# Patient Record
Sex: Female | Born: 1955 | Race: Black or African American | Hispanic: No | Smoking: Current every day smoker
Health system: Southern US, Community
[De-identification: ages and names within clinical notes are randomized; demographics above are authoritative.]

## PROBLEM LIST (undated history)

## (undated) DIAGNOSIS — Z21 Asymptomatic human immunodeficiency virus [HIV] infection status: Secondary | ICD-10-CM

## (undated) DIAGNOSIS — K8020 Calculus of gallbladder without cholecystitis without obstruction: Secondary | ICD-10-CM

## (undated) DIAGNOSIS — D649 Anemia, unspecified: Secondary | ICD-10-CM

## (undated) DIAGNOSIS — B20 Human immunodeficiency virus [HIV] disease: Secondary | ICD-10-CM

---

## 1999-02-16 ENCOUNTER — Emergency Department (HOSPITAL_COMMUNITY): Admission: EM | Admit: 1999-02-16 | Discharge: 1999-02-16 | Payer: Self-pay | Admitting: Emergency Medicine

## 1999-02-16 ENCOUNTER — Encounter: Payer: Self-pay | Admitting: Emergency Medicine

## 2000-04-06 ENCOUNTER — Emergency Department (HOSPITAL_COMMUNITY): Admission: EM | Admit: 2000-04-06 | Discharge: 2000-04-06 | Payer: Self-pay | Admitting: Emergency Medicine

## 2000-07-23 ENCOUNTER — Emergency Department (HOSPITAL_COMMUNITY): Admission: EM | Admit: 2000-07-23 | Discharge: 2000-07-23 | Payer: Self-pay | Admitting: Emergency Medicine

## 2001-06-14 ENCOUNTER — Emergency Department (HOSPITAL_COMMUNITY): Admission: EM | Admit: 2001-06-14 | Discharge: 2001-06-14 | Payer: Self-pay | Admitting: *Deleted

## 2002-11-26 ENCOUNTER — Emergency Department (HOSPITAL_COMMUNITY): Admission: EM | Admit: 2002-11-26 | Discharge: 2002-11-26 | Payer: Self-pay | Admitting: Emergency Medicine

## 2003-09-15 ENCOUNTER — Emergency Department (HOSPITAL_COMMUNITY): Admission: EM | Admit: 2003-09-15 | Discharge: 2003-09-16 | Payer: Self-pay | Admitting: Emergency Medicine

## 2003-09-16 ENCOUNTER — Ambulatory Visit (HOSPITAL_COMMUNITY): Admission: RE | Admit: 2003-09-16 | Discharge: 2003-09-16 | Payer: Self-pay | Admitting: *Deleted

## 2004-06-26 ENCOUNTER — Emergency Department (HOSPITAL_COMMUNITY): Admission: EM | Admit: 2004-06-26 | Discharge: 2004-06-26 | Payer: Self-pay | Admitting: *Deleted

## 2006-08-03 ENCOUNTER — Emergency Department (HOSPITAL_COMMUNITY): Admission: EM | Admit: 2006-08-03 | Discharge: 2006-08-03 | Payer: Self-pay | Admitting: Emergency Medicine

## 2007-10-24 ENCOUNTER — Emergency Department (HOSPITAL_COMMUNITY): Admission: EM | Admit: 2007-10-24 | Discharge: 2007-10-24 | Payer: Self-pay | Admitting: *Deleted

## 2008-12-03 ENCOUNTER — Emergency Department (HOSPITAL_COMMUNITY): Admission: EM | Admit: 2008-12-03 | Discharge: 2008-12-03 | Payer: Self-pay | Admitting: Emergency Medicine

## 2008-12-04 ENCOUNTER — Inpatient Hospital Stay (HOSPITAL_COMMUNITY): Admission: AD | Admit: 2008-12-04 | Discharge: 2008-12-14 | Payer: Self-pay | Admitting: Psychiatry

## 2008-12-04 ENCOUNTER — Ambulatory Visit: Payer: Self-pay | Admitting: Psychiatry

## 2009-08-12 ENCOUNTER — Emergency Department (HOSPITAL_COMMUNITY): Admission: EM | Admit: 2009-08-12 | Discharge: 2009-08-13 | Payer: Self-pay | Admitting: Emergency Medicine

## 2009-10-20 ENCOUNTER — Emergency Department (HOSPITAL_COMMUNITY): Admission: EM | Admit: 2009-10-20 | Discharge: 2009-10-20 | Payer: Self-pay | Admitting: Emergency Medicine

## 2011-02-03 LAB — URINALYSIS, ROUTINE W REFLEX MICROSCOPIC
Bilirubin Urine: NEGATIVE
Hgb urine dipstick: NEGATIVE
Nitrite: NEGATIVE
Specific Gravity, Urine: 1.025 (ref 1.005–1.030)
Urobilinogen, UA: 1 mg/dL (ref 0.0–1.0)
pH: 6.5 (ref 5.0–8.0)

## 2011-02-03 LAB — POCT I-STAT, CHEM 8
BUN: 25 mg/dL — ABNORMAL HIGH (ref 6–23)
Calcium, Ion: 1.15 mmol/L (ref 1.12–1.32)
Creatinine, Ser: 0.7 mg/dL (ref 0.4–1.2)
Glucose, Bld: 81 mg/dL (ref 70–99)
Hemoglobin: 13.6 g/dL (ref 12.0–15.0)
Potassium: 4 mEq/L (ref 3.5–5.1)
TCO2: 28 mmol/L (ref 0–100)

## 2011-02-03 LAB — URINE MICROSCOPIC-ADD ON

## 2011-02-03 LAB — COMPREHENSIVE METABOLIC PANEL
ALT: 13 U/L (ref 0–35)
AST: 22 U/L (ref 0–37)
CO2: 27 mEq/L (ref 19–32)
Calcium: 8.8 mg/dL (ref 8.4–10.5)
Creatinine, Ser: 0.56 mg/dL (ref 0.4–1.2)
GFR calc Af Amer: >60 mL/min (ref >60)
GFR calc non Af Amer: >60 mL/min (ref >60)
Sodium: 137 mEq/L (ref 135–145)
Total Protein: 6.3 g/dL (ref 6.0–8.3)

## 2011-02-03 LAB — ETHANOL: Alcohol, Ethyl (B): <5 mg/dL (ref 0–10)

## 2011-02-03 LAB — CBC
HCT: 36.3 % (ref 36.0–46.0)
Hemoglobin: 11.9 g/dL — ABNORMAL LOW (ref 12.0–15.0)
MCHC: 32.6 g/dL (ref 30.0–36.0)
MCV: 95.2 fL (ref 78.0–100.0)
Platelets: 234 10*3/uL (ref 150–400)
RBC: 3.81 MIL/uL — ABNORMAL LOW (ref 3.87–5.11)
RDW: 14.3 % (ref 11.5–15.5)

## 2011-02-03 LAB — TSH
TSH: 6.232 u[IU]/mL — ABNORMAL HIGH (ref 0.350–4.500)
TSH: 8.362 u[IU]/mL — ABNORMAL HIGH (ref 0.350–4.500)

## 2011-02-03 LAB — HEPATIC FUNCTION PANEL
Bilirubin, Direct: 0.1 mg/dL (ref 0.0–0.3)
Indirect Bilirubin: 0.5 mg/dL (ref 0.3–0.9)
Total Protein: 6.9 g/dL (ref 6.0–8.3)

## 2011-02-03 LAB — RAPID URINE DRUG SCREEN, HOSP PERFORMED: Tetrahydrocannabinol: NOT DETECTED
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
## 2011-03-06 NOTE — Discharge Summary (Signed)
[name], CORK NO.:  [id number]   MEDICAL RECORD NO.:  [id number]          PATIENT TYPE:  IPS   LOCATION:  0401                          [facility]ACILITY:  [facility]   PHYSICIAN:  Jasmine Pang, M.D. DATE OF BIRTH:  12-19-1955   DATE OF ADMISSION:  12/04/2008  DATE OF DISCHARGE:  12/14/2008                               DISCHARGE SUMMARY   IDENTIFICATION:  This is a 55 year old   Dictation ended at this point.      Jasmine Pang, M.D.  Electronically Signed     BHS/MEDQ  D:  01/05/2009  T:  01/06/2009  Job:  [id number]

## 2011-04-25 ENCOUNTER — Emergency Department (HOSPITAL_COMMUNITY)
Admission: EM | Admit: 2011-04-25 | Discharge: 2011-04-25 | Disposition: A | Discharge status: Home / Self Care | Payer: Self-pay | Attending: Emergency Medicine | Admitting: Emergency Medicine

## 2011-04-25 DIAGNOSIS — K219 Gastro-esophageal reflux disease without esophagitis: Secondary | ICD-10-CM | POA: Insufficient documentation

## 2011-04-25 DIAGNOSIS — R21 Rash and other nonspecific skin eruption: Secondary | ICD-10-CM | POA: Insufficient documentation

## 2011-04-25 DIAGNOSIS — L98499 Non-pressure chronic ulcer of skin of other sites with unspecified severity: Secondary | ICD-10-CM | POA: Insufficient documentation

## 2011-04-26 ENCOUNTER — Emergency Department (HOSPITAL_COMMUNITY)
Admission: EM | Admit: 2011-04-26 | Discharge: 2011-04-26 | Disposition: A | Discharge status: Home / Self Care | Payer: Self-pay | Attending: Emergency Medicine | Admitting: Emergency Medicine

## 2011-04-26 DIAGNOSIS — IMO0002 Reserved for concepts with insufficient information to code with codable children: Secondary | ICD-10-CM | POA: Insufficient documentation

## 2011-04-26 DIAGNOSIS — D649 Anemia, unspecified: Secondary | ICD-10-CM | POA: Insufficient documentation

## 2011-04-26 DIAGNOSIS — K219 Gastro-esophageal reflux disease without esophagitis: Secondary | ICD-10-CM | POA: Insufficient documentation

## 2011-04-26 DIAGNOSIS — X58XXXA Exposure to other specified factors, initial encounter: Secondary | ICD-10-CM | POA: Insufficient documentation

## 2011-07-08 LAB — I-STAT 8, (EC8 V) (CONVERTED LAB)
BUN: 8
Bicarbonate: 26.4 — ABNORMAL HIGH
Chloride: 105
Glucose, Bld: 107 — ABNORMAL HIGH
HCT: 36
Hemoglobin: 12.2
Operator id: 288831
Potassium: 3.4 — ABNORMAL LOW
Sodium: 140
TCO2: 28
pCO2, Ven: 48.6
pH, Ven: 7.343 — ABNORMAL HIGH

## 2011-07-08 LAB — POCT I-STAT CREATININE
Creatinine, Ser: 0.7
Operator id: 288831

## 2011-10-16 ENCOUNTER — Emergency Department (HOSPITAL_COMMUNITY)
Admission: EM | Admit: 2011-10-16 | Discharge: 2011-10-16 | Disposition: A | Discharge status: Home / Self Care | Payer: Self-pay | Attending: Emergency Medicine | Admitting: Emergency Medicine

## 2011-10-16 DIAGNOSIS — L259 Unspecified contact dermatitis, unspecified cause: Secondary | ICD-10-CM | POA: Insufficient documentation

## 2011-10-16 DIAGNOSIS — F17200 Nicotine dependence, unspecified, uncomplicated: Secondary | ICD-10-CM | POA: Insufficient documentation

## 2011-10-16 DIAGNOSIS — L299 Pruritus, unspecified: Secondary | ICD-10-CM | POA: Insufficient documentation

## 2011-10-16 DIAGNOSIS — L309 Dermatitis, unspecified: Secondary | ICD-10-CM

## 2011-10-16 MED ORDER — PREDNISONE 10 MG PO TABS: 20.0000 mg | ORAL_TABLET | Freq: Every day | ORAL | Status: DC

## 2011-10-16 MED ORDER — METHYLPREDNISOLONE SODIUM SUCC 125 MG IJ SOLR
125.0000 mg | Freq: Once | INTRAMUSCULAR | Status: AC
  Administered 2011-10-16: 125 mg via INTRAMUSCULAR
  Filled 2011-10-16: qty 2

## 2011-10-16 NOTE — ED Notes (Signed)
Pt. Has a rash all over her body,, broke out about 1 week ago

## 2011-10-16 NOTE — ED Provider Notes (Cosign Needed)
History     CSN: [id number]  Arrival date & time 10/16/11  1211   First MD Initiated Contact with Patient 10/16/11 1317      Chief Complaint  Patient presents with  . Rash    (Consider location/radiation/quality/duration/timing/severity/associated sxs/prior treatment) Patient is a 55 y.o. female presenting with rash. The history is provided by the patient (The patient complains of a burning rash for a few days. Patient has a rash on her chest back and legs.).  Rash  This is a new problem. The current episode started more than 2 days ago. The problem has not changed since onset.The problem is associated with nothing. There has been no fever. The rash is present on the trunk. The pain is at a severity of 0/10. The patient is experiencing no pain. Associated symptoms include itching. Pertinent negatives include no pain. She has tried nothing for the symptoms. The treatment provided no relief.    History reviewed. No pertinent past medical history.  History reviewed. No pertinent past surgical history.  No family history on file.  History  Substance Use Topics  . Smoking status: Current Everyday Smoker  . Smokeless tobacco: Never Used  . Alcohol Use: No    OB History    Grav Para Term Preterm Abortions TAB SAB Ect Mult Living                  Review of Systems  Constitutional: Negative for fatigue.  HENT: Negative for congestion, sinus pressure and ear discharge.   Eyes: Negative for discharge.  Respiratory: Negative for cough.   Cardiovascular: Negative for chest pain.  Gastrointestinal: Negative for abdominal pain and diarrhea.  Genitourinary: Negative for frequency and hematuria.  Musculoskeletal: Negative for back pain.  Skin: Positive for itching and rash.  Neurological: Negative for seizures and headaches.  Hematological: Negative.   Psychiatric/Behavioral: Negative for hallucinations.    Allergies  Review of patient's allergies indicates no known  allergies.  Home Medications   Current Outpatient Rx  Name Route Sig Dispense Refill  . PREDNISONE 10 MG PO TABS Oral Take 2 tablets (20 mg total) by mouth daily. 10 tablet 0    BP 107/70  Pulse 78  Temp(Src) 97.8 F (36.6 C) (Oral)  Resp 16  SpO2 99%  Physical Exam  Constitutional: She is oriented to person, place, and time. She appears well-developed.  HENT:  Head: Normocephalic and atraumatic.  Eyes: Conjunctivae and EOM are normal. No scleral icterus.  Neck: Neck supple. No thyromegaly present.  Cardiovascular: Normal rate and regular rhythm.  Exam reveals no gallop and no friction rub.   No murmur heard. Pulmonary/Chest: No stridor. She has no wheezes. She has no rales. She exhibits no tenderness.  Abdominal: She exhibits no distension. There is no tenderness. There is no rebound.  Musculoskeletal: Normal range of motion. She exhibits no edema.  Lymphadenopathy:    She has no cervical adenopathy.  Neurological: She is oriented to person, place, and time. Coordination normal.  Skin: Rash noted.       Patient has what appears to be welts on her chest back and lower extremities. The welts are puritic  Psychiatric: She has a normal mood and affect. Her behavior is normal.    ED Course  Procedures (including critical care time)  Labs Reviewed - No data to display No results found.   1. Dermatitis       MDM  Allergic dermatitis        Benny Lennert,  MD 10/16/11 1451
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
## 2012-03-23 ENCOUNTER — Emergency Department (HOSPITAL_COMMUNITY)
Admission: EM | Admit: 2012-03-23 | Discharge: 2012-03-23 | Disposition: A | Discharge status: Home / Self Care | Payer: Self-pay | Attending: Emergency Medicine | Admitting: Emergency Medicine

## 2012-03-23 ENCOUNTER — Encounter (HOSPITAL_COMMUNITY): Payer: Self-pay | Admitting: Emergency Medicine

## 2012-03-23 DIAGNOSIS — F17200 Nicotine dependence, unspecified, uncomplicated: Secondary | ICD-10-CM | POA: Insufficient documentation

## 2012-03-23 DIAGNOSIS — Z21 Asymptomatic human immunodeficiency virus [HIV] infection status: Secondary | ICD-10-CM | POA: Insufficient documentation

## 2012-03-23 DIAGNOSIS — R21 Rash and other nonspecific skin eruption: Secondary | ICD-10-CM | POA: Insufficient documentation

## 2012-03-23 HISTORY — DX: Human immunodeficiency virus (HIV) disease: B20

## 2012-03-23 HISTORY — DX: Calculus of gallbladder without cholecystitis without obstruction: K80.20

## 2012-03-23 HISTORY — DX: Anemia, unspecified: D64.9

## 2012-03-23 HISTORY — DX: Asymptomatic human immunodeficiency virus (hiv) infection status: Z21

## 2012-03-23 MED ORDER — HYDROXYZINE HCL 25 MG PO TABS: 25.0000 mg | ORAL_TABLET | Freq: Four times a day (QID) | ORAL | Status: AC

## 2012-03-23 MED ORDER — DEXAMETHASONE SODIUM PHOSPHATE 10 MG/ML IJ SOLN
10.0000 mg | Freq: Once | INTRAMUSCULAR | Status: AC
  Administered 2012-03-23: 10 mg via INTRAMUSCULAR
  Filled 2012-03-23: qty 1

## 2012-03-23 NOTE — ED Provider Notes (Signed)
History   This chart was scribed for Marcia Kras, MD by Melba Coon. The patient was seen in room STRE3/STRE3 and the patient's care was started at 12:44PM.    CSN: [id number]  Arrival date & time 03/23/12  1221   None     Chief Complaint  Patient presents with  . Rash    (Consider location/radiation/quality/duration/timing/severity/associated sxs/prior treatment) HPI [name] is a 56 y.o. female who presents to the Emergency Department complaining of a persistent, moderate to severe rash around the torso, buttocks, bilateral arms and legs with an onset 6 months ago that has gotten progressively worse. Pt states she has a Hx of HIV, but previous medical records only show Hx of dermatitis and pt does not take medications for HIV. Pt states she received a steroid shot at the shelter; also states that the needle wasn't "clean"; steroid shot did not alleviate any skin symptoms. No HA, fever, neck pain, sore throat, back pain, CP, SOB, abd pain, n/v/d, dysuria, or extremity pain, edema, weakness, numbness, or tingling. No known allergies. No other pertinent medical symptoms.  No PCP   Past Medical History  Diagnosis Date  . HIV (human immunodeficiency virus infection)   . Anemia   . Gallstone     History reviewed. No pertinent past surgical history.  No family history on file.  History  Substance Use Topics  . Smoking status: Current Everyday Smoker  . Smokeless tobacco: Never Used  . Alcohol Use: No    OB History    Grav Para Term Preterm Abortions TAB SAB Ect Mult Living                  Review of Systems 10 Systems reviewed and all are negative for acute change except as noted in the HPI.   Allergies  Review of patient's allergies indicates no known allergies.  Home Medications  No current outpatient prescriptions on file.  BP 125/81  Pulse 71  Temp(Src) 98.5 F (36.9 C) (Oral)  Resp 18  SpO2 99%  Physical Exam  Nursing note and vitals  reviewed. Constitutional: She appears well-developed and well-nourished. No distress.  HENT:  Head: Normocephalic and atraumatic.  Right Ear: External ear normal.  Left Ear: External ear normal.  Eyes: Conjunctivae are normal. Right eye exhibits no discharge. Left eye exhibits no discharge. No scleral icterus.  Neck: Neck supple. No tracheal deviation present.  Cardiovascular: Normal rate.   Pulmonary/Chest: Effort normal. No stridor. No respiratory distress.  Musculoskeletal: She exhibits no edema.  Neurological: She is alert. Cranial nerve deficit: no gross deficits.  Skin: Skin is warm and dry. No rash noted.       Light pink rash around the torso, bilateral buttocks arms and legs  Psychiatric: She has a normal mood and affect.    ED Course  Procedures (including critical care time)  DIAGNOSTIC STUDIES: Oxygen Saturation is 99% on room air, normal by my interpretation.    COORDINATION OF CARE:  12:48PM - steroid shot will be ordered for the pt.   Labs Reviewed - No data to display No results found.   1. Rash       MDM  Non specific rash.  Recc follow up with PCP.  Steroid offered as requested by patient. I personally performed the services described in this documentation, which was scribed in my presence.  The recorded information has been reviewed and considered.         Marcia Kras, MD 03/25/12  1333 
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
## 2012-03-23 NOTE — Discharge Instructions (Signed)
RESOURCE GUIDE  Chronic Pain Problems: Contact [facility]  [phone] Patients need to be referred by their primary care doctor.  Insufficient Money for Medicine: Contact United Way:  call "211" or [facility] [phone].  No Primary Care Doctor: - Call [facility]  [phone] - can help you locate a primary care doctor that  accepts your insurance, provides certain services, etc. - Physician Referral Service- 1-800-533-3463  Agencies that provide inexpensive medical care: - [facility]  [phone] - [facility]  [phone] - [facility]  [phone] - [facility]  [phone] - Planned Parenthood  [phone] - [facility]  [phone]  Medicaid-accepting [place] Providers: - [facility]- [address]  [phone], Mon-Fri 9am-7pm, Sat 9am-1pm - [facility]- [address], [address]  [phone] - [facility]- [address], [address]  [phone] - [facility]- [address] [place] [address]  [phone] - Veita Bland- [address], [phone]  Only accepts [place] Access Medicaid patients after they have their name  applied to their card  Self Pay (no insurance) in [place]: - Sickle Cell Patients: Dr Eric Dean, [facility]  [address], [phone] - [facility]- [address]  [phone]       -     [facility]- [address]       -     [facility]- see information above (Speak to Pam H if you do not have insurance)       -  [facility]- [address], [phone]       -  [facility] [place]- [address],  [phone]       -  [facility]- [address] [place] [address], [phone]       -  Dr Osei-Bonsu-  [address], [place], [phone]       -  [facility]- [address], [phone]       -  [facility]- [address] [place] [address], [phone], also [address], [phone]       -    [facility]- [address], [phone], 1st & 3rd Saturday   every month, 10am-1pm  1) Find a Doctor and Pay Out of Pocket Although you won't have to find out who is covered by your insurance plan, it is a good idea to ask around and get recommendations. You will then need to call the office and see if the doctor you have chosen will accept you as a new patient and what types of options they offer for patients who are self-pay. Some doctors offer discounts or will set up payment plans for their patients who do not have insurance, but you will need to ask so you aren't surprised when you get to your appointment.  2) Contact Your Local Health Department Not all health departments have doctors that can see patients for sick visits, but many do, so it is worth a call to see if yours does. If you don't know where your local health department is, you can check in your phone book. The CDC also has a tool to help you locate your state's health department, and many state websites also have   listings of all of their local health departments.  3) Find a Walk-in Clinic If your illness is not likely to be very severe or complicated, you may want to try a walk in clinic. These are popping up all over the country in pharmacies, drugstores, and shopping centers. They're usually staffed by nurse practitioners or physician assistants that have been trained to treat common illnesses and complaints. They're usually fairly quick and inexpensive. However, if you have serious medical issues or chronic medical problems, these are probably not your best option  STD Testing - [place] [facility] [place], [facility] Clinic, [address], [place], phone [phone] or 1-877-539-9860.  Monday - Friday, call for an appointment. - [place]  [facility] [place], [facility] Clinic, [address], [place], phone [phone] or 1-877-539-9860.  Monday - Friday, call for an appointment.  Abuse/Neglect: - [place] Child Abuse Hotline (336) 641-3795 - [place] Child Abuse Hotline 800-378-5315 (After Hours)  Emergency Shelter:  [facility] (336) 271-5985  Maternity Homes: - [facility] (336) 275-9566 - [facility] (704) 372-4663  MRSA Hotline #:   832-7006  [place] Resources  [facility] [place]  [employer] [place] [facility] [address]                 [address]         [address]  [place]                                               [place]                              [place] Phone:  [phone]                                  Phone:  [phone]                   Phone:  [phone]  [place] [facility], [phone] - [place] Services - [facility]- 1-888-581-9988       -     [facility] in [place], [address],                                  336-349-4454, Insurance  [place] Child Abuse Hotline (336) 342-1394 or (336) 342-3537 (After Hours)   Behavioral Health Services  Substance Abuse Resources: - [facility]  336-882-2125 - [facility] 336-784-9470 - [facility] 336-285-9073 - [facility] 336-845-3988 - Residential & Outpatient Substance Abuse Program  800-659-3381  Psychological Services: -  Health  [phone] - [facility]  [phone] - [place] [facility], [address], [place], ACCESS LINE: 1-800-853-5163 or 336-641-4981, Http://www.[facility].com/services/adult.htm  Dental Assistance  If unable to pay or uninsured, contact:  [facility] or [place] [facility] to become qualified for the adult dental  clinic.  Patients with Medicaid:  [facility] [address], [phone], [phone]  If unable   to pay, or uninsured, contact [facility] [phone]) or [facility] [phone] in [place], [phone] in [facility]) to become qualified for the adult dental clinic  Other Low-Cost Community Dental Services: - [facility]- [address] [place], [place], [place], [address], [phone], [phone], 2nd and 4th Thursday of the month at 6:30am.  10 clients each day by appointment, can sometimes see walk-in patients if someone does not show for an appointment. [facility]- [address] [place], [place], [address], [phone] - [facility]- [address] [place], [place], [address], [phone] [facility]- [phone] [facility] Health Department- [phone] [facility] [phone] -  Rash A rash is a change in the color or feel of your skin. There are many different types of rashes. You may have other problems along with your rash.  Follow up with a primary care doctor to have this rash evaluated further.   HOME CARE  Avoid the thing that caused your rash.   Do not scratch your rash.   You may take cools baths to help stop itching.   Only take medicines as told by your doctor.   Keep all doctor visits as told.  GET HELP RIGHT AWAY IF:   Your pain, puffiness (swelling), or redness gets worse.   You have a fever.   You have new or severe problems.   You have body aches, watery poop (diarrhea), or you throw up (vomit).   Your rash is not better after 3 days.  MAKE SURE YOU:   Understand these instructions.   Will watch your condition.   Will get help right away if you are not doing well or get worse.  Document Released: 03/23/2008 Document Revised: 09/24/2011 Document Reviewed: 07/20/2011 [facility] Patient Information 2012 [place],  [place].

## 2012-03-23 NOTE — ED Notes (Signed)
States have a rash for 6  months and worsening overtime light pink rash buttocks, arms and legs. Airway intact.

## 2020-02-23 ENCOUNTER — Other Ambulatory Visit: Payer: Self-pay | Admitting: Clinical

## 2020-02-27 ENCOUNTER — Encounter: Payer: Self-pay | Admitting: Clinical

## 2020-02-27 NOTE — Progress Notes (Signed)
[name] received call from sister [facility] requesting services for sister [name] due to missing appointment on Friday. [name] said she ended up going somewhere else where they said they were unable to help them, [name] informed they didn't come here as [name] was present and no patients came in on this day.  Sister shares [name] does not have a PCP, no insurance, hx of loss of a son and a husband and wants to help her get the services needed to apply for disability, etc as patient only has food stamps. [name] was not clear if [name] wanted MH therapy only kept referring it would help her. Based on all the information received today [name] recommends to come in for a PCP appointment to assess for needs and if MH is required it will be addressed. [name] informed of cost and recommended coming prior to pick up orange card for a lower cost. Sister agreed to come in to pick up application. [name] said patient lives with a son but sister takes care of needs so she receives the mail. [name] and [name] were unable to get full address due to unable to recognize the address that was being provided. We recommended on date of PCP appointment to bring in documentation of the address.

## 2020-04-16 ENCOUNTER — Ambulatory Visit: Payer: Self-pay | Admitting: Internal Medicine

## 2020-05-07 ENCOUNTER — Ambulatory Visit: Payer: Self-pay | Admitting: Internal Medicine

## 2020-05-20 ENCOUNTER — Ambulatory Visit: Payer: Self-pay | Admitting: Internal Medicine

## 2020-06-28 ENCOUNTER — Ambulatory Visit: Payer: Self-pay | Admitting: Internal Medicine

## 2020-06-28 ENCOUNTER — Encounter: Payer: Self-pay | Admitting: Internal Medicine

## 2021-04-17 ENCOUNTER — Other Ambulatory Visit (HOSPITAL_COMMUNITY): Payer: Self-pay | Admitting: Pulmonary Disease

## 2021-04-17 ENCOUNTER — Other Ambulatory Visit: Payer: Self-pay | Admitting: Pulmonary Disease

## 2021-04-17 DIAGNOSIS — R1013 Epigastric pain: Secondary | ICD-10-CM

## 2021-04-22 ENCOUNTER — Encounter (HOSPITAL_COMMUNITY): Payer: Self-pay | Admitting: *Deleted

## 2021-04-22 ENCOUNTER — Inpatient Hospital Stay (HOSPITAL_COMMUNITY)
Admission: EM | Admit: 2021-04-22 | Discharge: 2021-04-25 | DRG: 379 | Disposition: A | Discharge status: Home / Self Care | Payer: Medicaid Other | Attending: Internal Medicine | Admitting: Internal Medicine

## 2021-04-22 ENCOUNTER — Emergency Department (HOSPITAL_COMMUNITY): Payer: Medicaid Other

## 2021-04-22 ENCOUNTER — Inpatient Hospital Stay (HOSPITAL_COMMUNITY): Payer: Medicaid Other

## 2021-04-22 DIAGNOSIS — K319 Disease of stomach and duodenum, unspecified: Secondary | ICD-10-CM | POA: Diagnosis not present

## 2021-04-22 DIAGNOSIS — K5731 Diverticulosis of large intestine without perforation or abscess with bleeding: Principal | ICD-10-CM | POA: Diagnosis present

## 2021-04-22 DIAGNOSIS — K922 Gastrointestinal hemorrhage, unspecified: Secondary | ICD-10-CM | POA: Diagnosis not present

## 2021-04-22 DIAGNOSIS — D649 Anemia, unspecified: Secondary | ICD-10-CM

## 2021-04-22 DIAGNOSIS — Z79899 Other long term (current) drug therapy: Secondary | ICD-10-CM

## 2021-04-22 DIAGNOSIS — D509 Iron deficiency anemia, unspecified: Secondary | ICD-10-CM

## 2021-04-22 DIAGNOSIS — K5730 Diverticulosis of large intestine without perforation or abscess without bleeding: Secondary | ICD-10-CM | POA: Diagnosis not present

## 2021-04-22 DIAGNOSIS — F17210 Nicotine dependence, cigarettes, uncomplicated: Secondary | ICD-10-CM | POA: Diagnosis not present

## 2021-04-22 DIAGNOSIS — R519 Headache, unspecified: Secondary | ICD-10-CM | POA: Diagnosis present

## 2021-04-22 DIAGNOSIS — B20 Human immunodeficiency virus [HIV] disease: Secondary | ICD-10-CM

## 2021-04-22 DIAGNOSIS — Z20822 Contact with and (suspected) exposure to covid-19: Secondary | ICD-10-CM | POA: Diagnosis present

## 2021-04-22 LAB — FOLATE: Folate: 16.2 ng/mL (ref >5.9)

## 2021-04-22 LAB — PREPARE RBC (CROSSMATCH)

## 2021-04-22 LAB — COMPREHENSIVE METABOLIC PANEL
ALT: 9 U/L (ref 0–44)
AST: 23 U/L (ref 15–41)
Albumin: 3.9 g/dL (ref 3.5–5.0)
Alkaline Phosphatase: 59 U/L (ref 38–126)
Anion gap: 8 (ref 5–15)
BUN: 8 mg/dL (ref 8–23)
CO2: 26 mmol/L (ref 22–32)
Calcium: 9 mg/dL (ref 8.9–10.3)
Chloride: 105 mmol/L (ref 98–111)
Creatinine, Ser: 0.67 mg/dL (ref 0.44–1.00)
GFR, Estimated: >60 mL/min (ref >60)
Glucose, Bld: 89 mg/dL (ref 70–99)
Potassium: 3.6 mmol/L (ref 3.5–5.1)
Sodium: 139 mmol/L (ref 135–145)
Total Bilirubin: 0.9 mg/dL (ref 0.3–1.2)
Total Protein: 7.6 g/dL (ref 6.5–8.1)

## 2021-04-22 LAB — ABO/RH: ABO/RH(D): O POS

## 2021-04-22 LAB — PROTIME-INR
INR: 1.1 (ref 0.8–1.2)
Prothrombin Time: 14.3 seconds (ref 11.4–15.2)

## 2021-04-22 LAB — RETICULOCYTES
Immature Retic Fract: 23.6 % — ABNORMAL HIGH (ref 2.3–15.9)
RBC.: 2.62 MIL/uL — ABNORMAL LOW (ref 3.87–5.11)
Retic Count, Absolute: 22.8 10*3/uL (ref 19.0–186.0)
Retic Ct Pct: 0.9 % (ref 0.4–3.1)

## 2021-04-22 LAB — RESP PANEL BY RT-PCR (FLU A&B, COVID) ARPGX2
Influenza A by PCR: NEGATIVE
Influenza B by PCR: NEGATIVE
SARS Coronavirus 2 by RT PCR: NEGATIVE

## 2021-04-22 LAB — CBC
HCT: 17.4 % — ABNORMAL LOW (ref 36.0–46.0)
Hemoglobin: 4.5 g/dL — CL (ref 12.0–15.0)
MCH: 17.2 pg — ABNORMAL LOW (ref 26.0–34.0)
MCHC: 25.9 g/dL — ABNORMAL LOW (ref 30.0–36.0)
MCV: 66.7 fL — ABNORMAL LOW (ref 80.0–100.0)
Platelets: 799 10*3/uL — ABNORMAL HIGH (ref 150–400)
RBC: 2.61 MIL/uL — ABNORMAL LOW (ref 3.87–5.11)
RDW: 31.9 % — ABNORMAL HIGH (ref 11.5–15.5)
WBC: 4.4 10*3/uL (ref 4.0–10.5)
nRBC: 0.7 % — ABNORMAL HIGH (ref 0.0–0.2)

## 2021-04-22 LAB — IRON AND TIBC
Iron: 17 ug/dL — ABNORMAL LOW (ref 28–170)
Saturation Ratios: 3 % — ABNORMAL LOW (ref 10.4–31.8)
TIBC: 622 ug/dL — ABNORMAL HIGH (ref 250–450)
UIBC: 605 ug/dL

## 2021-04-22 LAB — LIPASE, BLOOD: Lipase: 33 U/L (ref 11–51)

## 2021-04-22 LAB — POC OCCULT BLOOD, ED: Fecal Occult Bld: NEGATIVE

## 2021-04-22 LAB — FERRITIN
Ferritin: 1 ng/mL — ABNORMAL LOW (ref 11–307)
Ferritin: 1 ng/mL — ABNORMAL LOW (ref 11–307)

## 2021-04-22 LAB — VITAMIN B12: Vitamin B-12: 589 pg/mL (ref 180–914)

## 2021-04-22 LAB — TSH: TSH: 3.66 u[IU]/mL (ref 0.350–4.500)

## 2021-04-22 MED ORDER — ONDANSETRON HCL 4 MG PO TABS: 4.0000 mg | ORAL_TABLET | Freq: Four times a day (QID) | ORAL | Status: DC | PRN

## 2021-04-22 MED ORDER — DEXTROSE-NACL 5-0.45 % IV SOLN: INTRAVENOUS | Status: DC

## 2021-04-22 MED ORDER — SENNOSIDES-DOCUSATE SODIUM 8.6-50 MG PO TABS: 1.0000 | ORAL_TABLET | Freq: Every evening | ORAL | Status: DC | PRN

## 2021-04-22 MED ORDER — SODIUM CHLORIDE 0.9 % IV SOLN
10.0000 mL/h | Freq: Once | INTRAVENOUS | Status: AC
  Administered 2021-04-22: 10 mL/h via INTRAVENOUS

## 2021-04-22 MED ORDER — DM-GUAIFENESIN ER 30-600 MG PO TB12: 1.0000 | ORAL_TABLET | Freq: Two times a day (BID) | ORAL | Status: DC | PRN

## 2021-04-22 MED ORDER — IPRATROPIUM-ALBUTEROL 0.5-2.5 (3) MG/3ML IN SOLN: 3.0000 mL | RESPIRATORY_TRACT | Status: DC | PRN

## 2021-04-22 MED ORDER — POTASSIUM CHLORIDE 10 MEQ/100ML IV SOLN
10.0000 meq | INTRAVENOUS | Status: AC
  Administered 2021-04-22: 10 meq via INTRAVENOUS
  Filled 2021-04-22: qty 100

## 2021-04-22 MED ORDER — ACETAMINOPHEN 325 MG PO TABS
650.0000 mg | ORAL_TABLET | Freq: Four times a day (QID) | ORAL | Status: DC | PRN
  Administered 2021-04-23: 650 mg via ORAL
  Filled 2021-04-22: qty 2

## 2021-04-22 MED ORDER — HYDROCODONE-ACETAMINOPHEN 5-325 MG PO TABS: 1.0000 | ORAL_TABLET | ORAL | Status: DC | PRN

## 2021-04-22 MED ORDER — IOHEXOL 300 MG/ML  SOLN
100.0000 mL | Freq: Once | INTRAMUSCULAR | Status: AC | PRN
  Administered 2021-04-22: 100 mL via INTRAVENOUS

## 2021-04-22 MED ORDER — BISACODYL 5 MG PO TBEC: 5.0000 mg | DELAYED_RELEASE_TABLET | Freq: Every day | ORAL | Status: DC | PRN

## 2021-04-22 MED ORDER — PANTOPRAZOLE SODIUM 40 MG IV SOLR
40.0000 mg | Freq: Two times a day (BID) | INTRAVENOUS | Status: DC
  Administered 2021-04-22 – 2021-04-24 (×3): 40 mg via INTRAVENOUS
  Filled 2021-04-22 (×4): qty 40

## 2021-04-22 MED ORDER — ONDANSETRON HCL 4 MG/2ML IJ SOLN: 4.0000 mg | Freq: Four times a day (QID) | INTRAMUSCULAR | Status: DC | PRN

## 2021-04-22 NOTE — ED Notes (Signed)
Patient transported to CT 

## 2021-04-22 NOTE — ED Notes (Signed)
Blood bank has 2 units of blood ready for this patient. Notified Danielle,RN.

## 2021-04-22 NOTE — ED Provider Notes (Signed)
Emergency Medicine Provider Triage Evaluation Note  [name] , a 65 y.o. female  was evaluated in triage.  Pt complains of headache and abdominal pain.  Reports nausea but denies any vomiting.  Intermittent symptoms for the past few weeks.  Denies any changes to gait, vision changes, numbness in arms or legs.  Review of Systems  Positive: Headache, abdominal pain, nausea Negative: Vomiting, vision changes,  Physical Exam  BP 109/72 (BP Location: Right Arm)   Pulse 92   Temp 99.1 F (37.3 C) (Oral)   Resp 18   SpO2 97%  Gen:   Awake, no distress   Resp:  Normal effort  MSK:   Moves extremities without difficulty  Other:  Tenderness in the generalized abdominal area, moving all extremities, ambulatory without difficulty  Medical Decision Making  Medically screening exam initiated at 2:17 PM.  Appropriate orders placed.  [name] was informed that the remainder of the evaluation will be completed by another provider, this initial triage assessment does not replace that evaluation, and the importance of remaining in the ED until their evaluation is complete.  Imaging and labs ordered   Dietrich Pates, Cordelia Poche 04/22/21 1418    Linwood Dibbles, MD 04/23/21 [phone]

## 2021-04-22 NOTE — ED Provider Notes (Signed)
[facility]-EMERGENCY DEPT Provider Note   CSN: [id number] Arrival date & time: 04/22/21  1352     History Chief Complaint  Patient presents with   Abdominal Pain    [name] is a 65 y.o. female.   Abdominal Pain  Patient presents to the ED for evaluation of abdominal pain and cramping.  She is also had nausea but no vomiting.  She also has had persistent headache ongoing for several weeks.  She denies any fevers or chills.  Patient also has noticed some blood in her stool.  She denies any focal numbness or weakness.  No neck pain.  No rashes.  Patient went to her doctor's office.  She had blood test and was told she was anemic.  She was sent to the ED for further evaluation  Past Medical History:  Diagnosis Date   Anemia    Gallstone    HIV (human immunodeficiency virus infection) (HCC)     There are no problems to display for this patient.   History reviewed. No pertinent surgical history.   OB History   No obstetric history on file.     No family history on file.  Social History   Tobacco Use   Smoking status: Every Day    Pack years: 0.00   Smokeless tobacco: Never  Substance Use Topics   Alcohol use: No   Drug use: No    Home Medications Prior to Admission medications   Medication Sig Start Date End Date Taking? Authorizing Provider  Aspirin-Acetaminophen-Caffeine (GOODY HEADACHE PO) Take 1 packet by mouth 2 (two) times daily as needed (FOR HEADACHES).   Yes [provider]  cefUROXime (CEFTIN) 250 MG tablet Take 250 mg by mouth 2 (two) times daily. 04/17/21  Yes [provider]    Allergies    Patient has no known allergies.  Review of Systems   Review of Systems  Gastrointestinal:  Positive for abdominal pain.  All other systems reviewed and are negative.  Physical Exam Updated Vital Signs BP 119/71   Pulse (!) 58   Temp 99.1 F (37.3 C) (Oral)   Resp 16   SpO2 100%   Physical Exam Vitals and  nursing note reviewed.  Constitutional:      Appearance: She is well-developed.     Comments: Thin underweight  HENT:     Head: Normocephalic and atraumatic.     Right Ear: External ear normal.     Left Ear: External ear normal.  Eyes:     General: No scleral icterus.       Right eye: No discharge.        Left eye: No discharge.     Conjunctiva/sclera: Conjunctivae normal.  Neck:     Trachea: No tracheal deviation.  Cardiovascular:     Rate and Rhythm: Normal rate and regular rhythm.  Pulmonary:     Effort: Pulmonary effort is normal. No respiratory distress.     Breath sounds: Normal breath sounds. No stridor. No wheezing or rales.  Abdominal:     General: Bowel sounds are normal. There is no distension.     Palpations: Abdomen is soft.     Tenderness: There is no abdominal tenderness. There is no guarding or rebound.  Genitourinary:    Comments: Dark stool, no bright red blood Musculoskeletal:        General: No tenderness or deformity.     Cervical back: Neck supple.  Skin:    General: Skin is  warm and dry.     Findings: No rash.  Neurological:     General: No focal deficit present.     Mental Status: She is alert.     Cranial Nerves: No cranial nerve deficit (no facial droop, extraocular movements intact, no slurred speech).     Sensory: No sensory deficit.     Motor: No abnormal muscle tone or seizure activity.     Coordination: Coordination normal.  Psychiatric:        Mood and Affect: Mood normal.    ED Results / Procedures / Treatments   Labs (all labs ordered are listed, but only abnormal results are displayed) Labs Reviewed  CBC - Abnormal; Notable for the following components:      Result Value   RBC 2.61 (*)    Hemoglobin 4.5 (*)    HCT 17.4 (*)    MCV 66.7 (*)    MCH 17.2 (*)    MCHC 25.9 (*)    RDW 31.9 (*)    Platelets 799 (*)    nRBC 0.7 (*)    All other components within normal limits  RESP PANEL BY RT-PCR (FLU A&B, COVID) ARPGX2  LIPASE,  BLOOD  COMPREHENSIVE METABOLIC PANEL  URINALYSIS, ROUTINE W REFLEX MICROSCOPIC  VITAMIN B12  FOLATE  IRON AND TIBC  FERRITIN  RETICULOCYTES  POC OCCULT BLOOD, ED  TYPE AND SCREEN    EKG None  Radiology No results found.  Procedures .Critical Care  Date/Time: 04/22/2021 4:25 PM Performed by: Linwood Dibbles, MD Authorized by: Linwood Dibbles, MD   Critical care provider statement:    Critical care time (minutes):  30   Critical care was time spent personally by me on the following activities:  Discussions with consultants, evaluation of patient's response to treatment, examination of patient, ordering and performing treatments and interventions, ordering and review of laboratory studies, ordering and review of radiographic studies, pulse oximetry, re-evaluation of patient's condition, obtaining history from patient or surrogate and review of old charts   Medications Ordered in ED Medications  0.9 %  sodium chloride infusion (has no administration in time range)    ED Course  I have reviewed the triage vital signs and the nursing notes.  Pertinent labs & imaging results that were available during my care of the patient were reviewed by me and considered in my medical decision making (see chart for details).  Clinical Course as of 04/23/21 0859  Tue Apr 22, 2021  1550 Head CT negative [JK]    Clinical Course User Index [JK] Linwood Dibbles, MD   MDM Rules/Calculators/A&P                          Patient presented to the ED for evaluation of headache as well as abdominal pain.  Patient's abdominal exam is benign.  No peritoneal signs.  No guarding.  No signs of hepatitis or pancreatitis on laboratory test.  Patient's blood count however is severely decreased at 4.5.  She has low MCV.  Patient's outpatient laboratory test showed similar values.  Patient has noticed blood in her stool.  I have sent off a stool Hemoccult card.  I have ordered blood transfusions.  Likely require GI  consultation.  I will consult for admission further treatment Final Clinical Impression(s) / ED Diagnoses Final diagnoses:  Anemia, unspecified type     Linwood Dibbles, MD 04/23/21 0900

## 2021-04-22 NOTE — Consult Note (Addendum)
Referring Provider: Dr. Loura Halt Primary Care Physician:  Corine Shelter, MD Primary Gastroenterologist:  None  Reason for Consultation:  Anemia, Abdominal pain  HPI: [name] is a 65 y.o. female presents to the ER with headache and abdominal pain found to be severely anemic 4.5 with microcytosis.  BUN at 8. Patient has HIV and past medical history however when asked patient denies this. Patient's been complaining of lower abdominal cramping intermittently for the last month, improves with bowel movement.  Events have been every 2 to 3 days, soft, no straining  Patient seen intermittent red blood in the stool stool small volume per patient last seen about 2 weeks ago negative Hemoccult in the ER today. Patient denies any blood thinner use, but has taken Goody powders to daily for headaches for years. Denies ETOH or drug use.  Patient has had some nausea but denies reflux, vomiting, melena. [name] has never had a colonoscopy.   Denies family history of colon cancer.  Past Medical History:  Diagnosis Date   Anemia    Gallstone    HIV (human immunodeficiency virus infection) (HCC)     History reviewed. No pertinent surgical history.  Prior to Admission medications   Medication Sig Start Date End Date Taking? Authorizing Provider  Aspirin-Acetaminophen-Caffeine (GOODY HEADACHE PO) Take 1 packet by mouth 2 (two) times daily as needed (FOR HEADACHES).   Yes [provider]  cefUROXime (CEFTIN) 250 MG tablet Take 250 mg by mouth 2 (two) times daily. 04/17/21  Yes [provider]    Scheduled Meds:  pantoprazole (PROTONIX) IV  40 mg Intravenous Q12H   Continuous Infusions:  sodium chloride     dextrose 5 % and 0.45% NaCl     potassium chloride     PRN Meds:.acetaminophen, bisacodyl, dextromethorphan-guaiFENesin, HYDROcodone-acetaminophen, ipratropium-albuterol, ondansetron **OR** ondansetron (ZOFRAN) IV, senna-docusate  Allergies as of 04/22/2021   (No  Known Allergies)    No family history on file.  Social History   Socioeconomic History   Marital status: Widowed    Spouse name: Not on file   Number of children: Not on file   Years of education: Not on file   Highest education level: Not on file  Occupational History   Not on file  Tobacco Use   Smoking status: Every Day    Pack years: 0.00   Smokeless tobacco: Never  Substance and Sexual Activity   Alcohol use: No   Drug use: No   Sexual activity: Never  Other Topics Concern   Not on file  Social History Narrative   Not on file   Social Determinants of Health   Financial Resource Strain: Not on file  Food Insecurity: Not on file  Transportation Needs: Not on file  Physical Activity: Not on file  Stress: Not on file  Social Connections: Not on file  Intimate Partner Violence: Not on file    Review of Systems:  Review of Systems  Constitutional:  Positive for malaise/fatigue. Negative for chills and fever.  HENT:  Negative for hearing loss.   Eyes:  Negative for redness.  Respiratory:  Positive for shortness of breath. Negative for cough.   Cardiovascular:  Negative for chest pain and leg swelling.  Gastrointestinal:  Positive for abdominal pain, blood in stool and nausea. Negative for constipation, diarrhea, heartburn, melena and vomiting.  Musculoskeletal:  Negative for falls.  Skin:  Negative for rash.  Neurological:  Positive for headaches.  Psychiatric/Behavioral:  Negative for memory loss.  Physical Exam: Vital signs: Vitals:   04/22/21 1630 04/22/21 1700  BP: 122/68 125/61  Pulse: 65 60  Resp: 16 16  Temp:    SpO2: 100% 100%     Physical Exam Constitutional:      General: [name] is not in acute distress.    Appearance: [name] is well-developed.  HENT:     Head:     Comments: Poor dentition. Cardiovascular:     Rate and Rhythm: Normal rate and regular rhythm.     Heart sounds: No murmur heard. Pulmonary:     Effort: Pulmonary effort is  normal.     Breath sounds: Normal breath sounds.  Abdominal:     Comments: Well healed vertical surgical scar from prior hysterectomy, normal bowel sounds, no distension, mild bilateral lower AB tenderness without rebound.   Skin:    General: Skin is warm.     Coloration: Skin is not jaundiced.  Neurological:     General: No focal deficit present.     Mental Status: [name] is alert.     GI:  Lab Results: Recent Labs    04/22/21 1413  WBC 4.4  HGB 4.5*  HCT 17.4*  PLT 799*   BMET Recent Labs    04/22/21 1413  NA 139  K 3.6  CL 105  CO2 26  GLUCOSE 89  BUN 8  CREATININE 0.67  CALCIUM 9.0   LFT Recent Labs    04/22/21 1413  PROT 7.6  ALBUMIN 3.9  AST 23  ALT 9  ALKPHOS 59  BILITOT 0.9   PT/INR No results for input(s): LABPROT, INR in the last 72 hours.  Studies/Results: No results found.  Impression and Plan Symptomatic anemia in patient with lower abdominal cramping Goody powder use Negative hemoccult, patient stable.  Possible slow UGI bleed from chronic Goody powder use versus Lower GI bleed- suggest both EGD and colon however will schedule for CT AB and pelvis with contrast first to evaluate for any colitis, malignancy.  Would be helpful to get notes from PCP on prior CBC Keep NPO at midnight and will discuss with Dr. Ewing Schlein if we want to proceed with EGD tomorrow followed by colonoscopy Thursday or do a double on Thursday.  Continue protonix IV BID Continue to monitor H&H with transfusion as needed to maintain hemoglobin greater than 7. Recommend proceeding with bleeding scan if the patient becomes unstable overnight (with IR consultation if positive).    HIV status Unknown, patient denies HIV status Suggest checking again while in patient and or getting PCP notes.   Call if any questions    LOS: 0 days   Doree Albee  PA-C 04/22/2021, 5:26 PM  Contact #  [phone]

## 2021-04-22 NOTE — H&P (Signed)
History and Physical    [name] YKD:[id number] DOB: 10-09-1956 DOA: 04/22/2021  PCP: Corine Shelter, MD Patient coming from: Home  Chief Complaint: Headaches  HPI: [name] is a 65 y.o. female with medical history significant of chronic headache, HIV?  Comes to the hospital with complains of headache and abdominal pain.  Patient states she has had headache for about a year and started having slight abdominal discomfort about a month ago.  To help with this pain she has been taking Goody powders at least 2-3 times daily for the past 2 months or so.  Denies taking any other medications at this time.  Admits at times she may notice slight red tinge stool but otherwise no other bleeding.  In the ER patient's hemoglobin was noted to be 4.5, 2 units PRBC transfusion was ordered.  GI was consulted.  Started on PPI twice daily and admitted to the hospital.   Review of Systems: As per HPI otherwise 10 point review of systems negative.  Review of Systems Otherwise negative except as per HPI, including: General: Denies fever, chills, night sweats or unintended weight loss. Resp: Denies cough, wheezing, shortness of breath. Cardiac: Denies chest pain, palpitations, orthopnea, paroxysmal nocturnal dyspnea. GI: Denies  nausea, vomiting, diarrhea or constipation GU: Denies dysuria, frequency, hesitancy or incontinence MS: Denies muscle aches, joint pain or swelling Neuro: Denies  neurologic deficits (focal weakness, numbness, tingling), abnormal gait Psych: Denies anxiety, depression, SI/HI/AVH Skin: Denies new rashes or lesions ID: Denies sick contacts, exotic exposures, travel  Past Medical History:  Diagnosis Date   Anemia    Gallstone    HIV (human immunodeficiency virus infection) (HCC)     History reviewed. No pertinent surgical history.  SOCIAL HISTORY:  reports that she has been smoking. She has never used smokeless tobacco. She reports that she does not drink alcohol  and does not use drugs.  No Known Allergies  FAMILY HISTORY: No family history on file.   Prior to Admission medications   Medication Sig Start Date End Date Taking? Authorizing Provider  Aspirin-Acetaminophen-Caffeine (GOODY HEADACHE PO) Take 1 packet by mouth 2 (two) times daily as needed (FOR HEADACHES).   Yes [provider]  cefUROXime (CEFTIN) 250 MG tablet Take 250 mg by mouth 2 (two) times daily. 04/17/21  Yes [provider]    Physical Exam: Vitals:   04/22/21 1530 04/22/21 1600 04/22/21 1630 04/22/21 1700  BP: 119/71 119/71 122/68 125/61  Pulse: (!) 58 61 65 60  Resp: 16 16 16 16   Temp:      TempSrc:      SpO2: 100% 96% 100% 100%      Constitutional: NAD, calm, comfortable Eyes: PERRL, lids and conjunctivae normal ENMT: Mucous membranes are moist. Posterior pharynx clear of any exudate or lesions.Normal dentition.  Neck: normal, supple, no masses, no thyromegaly Respiratory: clear to auscultation bilaterally, no wheezing, no crackles. Normal respiratory effort. No accessory muscle use.  Cardiovascular: Regular rate and rhythm, no murmurs / rubs / gallops. No extremity edema. 2+ pedal pulses. No carotid bruits.  Abdomen: no tenderness, no masses palpated. No hepatosplenomegaly. Bowel sounds positive.  Musculoskeletal: no clubbing / cyanosis. No joint deformity upper and lower extremities. Good ROM, no contractures. Normal muscle tone.  Skin: no rashes, lesions, ulcers. No induration Neurologic: CN 2-12 grossly intact. Sensation intact, DTR normal. Strength 5/5 in all 4.  Psychiatric: Normal judgment and insight. Alert and oriented x 3. Normal mood.     Labs on  Admission: I have personally reviewed following labs and imaging studies  CBC: Recent Labs  Lab 04/22/21 1413  WBC 4.4  HGB 4.5*  HCT 17.4*  MCV 66.7*  PLT 799*   Basic Metabolic Panel: Recent Labs  Lab 04/22/21 1413  NA 139  K 3.6  CL 105  CO2 26  GLUCOSE 89  BUN 8   CREATININE 0.67  CALCIUM 9.0   GFR: CrCl cannot be calculated (Unknown ideal weight.). Liver Function Tests: Recent Labs  Lab 04/22/21 1413  AST 23  ALT 9  ALKPHOS 59  BILITOT 0.9  PROT 7.6  ALBUMIN 3.9   Recent Labs  Lab 04/22/21 1413  LIPASE 33   No results for input(s): AMMONIA in the last 168 hours. Coagulation Profile: No results for input(s): INR, PROTIME in the last 168 hours. Cardiac Enzymes: No results for input(s): CKTOTAL, CKMB, CKMBINDEX, TROPONINI in the last 168 hours. BNP (last 3 results) No results for input(s): PROBNP in the last 8760 hours. HbA1C: No results for input(s): HGBA1C in the last 72 hours. CBG: No results for input(s): GLUCAP in the last 168 hours. Lipid Profile: No results for input(s): CHOL, HDL, LDLCALC, TRIG, CHOLHDL, LDLDIRECT in the last 72 hours. Thyroid Function Tests: No results for input(s): TSH, T4TOTAL, FREET4, T3FREE, THYROIDAB in the last 72 hours. Anemia Panel: Recent Labs    04/22/21 1432  RETICCTPCT 0.9   Urine analysis:    Component Value Date/Time   COLORURINE YELLOW 12/05/2008 1505   APPEARANCEUR CLEAR 12/05/2008 1505   LABSPEC 1.025 12/05/2008 1505   PHURINE 6.5 12/05/2008 1505   GLUCOSEU NEGATIVE 12/05/2008 1505   HGBUR NEGATIVE 12/05/2008 1505   BILIRUBINUR NEGATIVE 12/05/2008 1505   KETONESUR NEGATIVE 12/05/2008 1505   PROTEINUR NEGATIVE 12/05/2008 1505   UROBILINOGEN 1.0 12/05/2008 1505   NITRITE NEGATIVE 12/05/2008 1505   LEUKOCYTESUR TRACE (A) 12/05/2008 1505   Sepsis Labs: !!!!!!!!!!!!!!!!!!!!!!!!!!!!!!!!!!!!!!!!!!!! @LABRCNTIP (procalcitonin:4,lacticidven:4) )No results found for this or any previous visit (from the past 240 hour(s)).   Radiological Exams on Admission: No results found.     Assessment/Plan Principal Problem:   Microcytic anemia Active Problems:   GI bleed   HIV (human immunodeficiency virus infection) (HCC)    Microcytic symptomatic anemia Abdominal pain  unspecified - Admission hemoglobin 4.5.  Unknown baseline.  Unclear etiology.  She does use Goody powder therefore could be gastritis/ulcer slow bleeding over time versus lower GI bleed.  Never had C-scope in the past. - Clear liquid diet, n.p.o. past midnight - 2 units PRBC transfusion.  Repeat CBC at 10 PM - GI consulted.  Likely will require EGD/C-scope -Iron studies, B12, folate -D5 half-normal saline 75 cc/h, Accu-Cheks every 6 hours while n.p.o. - Lipase, LFTs normal  Headache - CT head negative.  Suspect secondary to anemia.  Should improve with transfusion and fluids  HIV?  Per history    DVT prophylaxis: SCDs Code Status: Full Family Communication: Family at bedside Consults called: GI, [facility] Admission status: Inpatient  Status is: Inpatient  Remains inpatient appropriate because:Inpatient level of care appropriate due to severity of illness  Dispo: The patient is from: Home              Anticipated d/c is to: Home              Patient currently is not medically stable to d/c.   Difficult to place patient No       Time Spent: 65 minutes.  >50% of the time was devoted to discussing  the patients care, assessment, plan and disposition with other care givers along with counseling the patient about the risks and benefits of treatment.    Tahani Potier Joline Maxcy MD [facility]  If 7PM-7AM, please contact night-coverage   04/22/2021, 5:18 PM

## 2021-04-22 NOTE — ED Triage Notes (Signed)
Pt complains of headache and abdominal pain for the past month. She has occasional nausea but has not thrown up.

## 2021-04-23 ENCOUNTER — Encounter (HOSPITAL_COMMUNITY)
Admission: EM | Disposition: A | Discharge status: Home / Self Care | Payer: Self-pay | Source: Home / Self Care | Attending: Internal Medicine

## 2021-04-23 ENCOUNTER — Encounter (HOSPITAL_COMMUNITY): Payer: Self-pay | Admitting: Internal Medicine

## 2021-04-23 ENCOUNTER — Inpatient Hospital Stay (HOSPITAL_COMMUNITY): Payer: Medicaid Other | Admitting: Certified Registered Nurse Anesthetist

## 2021-04-23 DIAGNOSIS — D509 Iron deficiency anemia, unspecified: Secondary | ICD-10-CM | POA: Diagnosis not present

## 2021-04-23 DIAGNOSIS — K922 Gastrointestinal hemorrhage, unspecified: Secondary | ICD-10-CM | POA: Diagnosis not present

## 2021-04-23 HISTORY — PX: ESOPHAGOGASTRODUODENOSCOPY (EGD) WITH PROPOFOL: SHX5813

## 2021-04-23 LAB — FOLATE RBC
Folate, Hemolysate: 379 ng/mL
Folate, RBC: 2071 ng/mL (ref >498)
Hematocrit: 18.3 % — ABNORMAL LOW (ref 34.0–46.6)

## 2021-04-23 LAB — COMPREHENSIVE METABOLIC PANEL
ALT: 8 U/L (ref 0–44)
AST: 22 U/L (ref 15–41)
Albumin: 3.5 g/dL (ref 3.5–5.0)
Alkaline Phosphatase: 62 U/L (ref 38–126)
Anion gap: 9 (ref 5–15)
BUN: 6 mg/dL — ABNORMAL LOW (ref 8–23)
CO2: 25 mmol/L (ref 22–32)
Calcium: 8.5 mg/dL — ABNORMAL LOW (ref 8.9–10.3)
Chloride: 104 mmol/L (ref 98–111)
Creatinine, Ser: 0.64 mg/dL (ref 0.44–1.00)
GFR, Estimated: >60 mL/min (ref >60)
Glucose, Bld: 147 mg/dL — ABNORMAL HIGH (ref 70–99)
Potassium: 3.5 mmol/L (ref 3.5–5.1)
Sodium: 138 mmol/L (ref 135–145)
Total Bilirubin: 1.6 mg/dL — ABNORMAL HIGH (ref 0.3–1.2)
Total Protein: 7.1 g/dL (ref 6.5–8.1)

## 2021-04-23 LAB — BPAM RBC
Blood Product Expiration Date: 202208072359
Blood Product Expiration Date: 202208072359
ISSUE DATE / TIME: 202207051846
ISSUE DATE / TIME: 202207052232
Unit Type and Rh: 5100
Unit Type and Rh: 5100

## 2021-04-23 LAB — CBG MONITORING, ED
Glucose-Capillary: 130 mg/dL — ABNORMAL HIGH (ref 70–99)
Glucose-Capillary: 71 mg/dL (ref 70–99)
Glucose-Capillary: 71 mg/dL (ref 70–99)

## 2021-04-23 LAB — TYPE AND SCREEN
ABO/RH(D): O POS
Antibody Screen: NEGATIVE
Unit division: 0
Unit division: 0

## 2021-04-23 LAB — URINALYSIS, ROUTINE W REFLEX MICROSCOPIC
Bilirubin Urine: NEGATIVE
Glucose, UA: NEGATIVE mg/dL
Hgb urine dipstick: NEGATIVE
Ketones, ur: NEGATIVE mg/dL
Leukocytes,Ua: NEGATIVE
Nitrite: NEGATIVE
Protein, ur: NEGATIVE mg/dL
Specific Gravity, Urine: 1.031 — ABNORMAL HIGH (ref 1.005–1.030)
pH: 5 (ref 5.0–8.0)

## 2021-04-23 LAB — CBC
HCT: 27.8 % — ABNORMAL LOW (ref 36.0–46.0)
Hemoglobin: 8.1 g/dL — ABNORMAL LOW (ref 12.0–15.0)
MCH: 21.9 pg — ABNORMAL LOW (ref 26.0–34.0)
MCHC: 29.1 g/dL — ABNORMAL LOW (ref 30.0–36.0)
MCV: 75.1 fL — ABNORMAL LOW (ref 80.0–100.0)
Platelets: 496 10*3/uL — ABNORMAL HIGH (ref 150–400)
RBC: 3.7 MIL/uL — ABNORMAL LOW (ref 3.87–5.11)
RDW: 30.7 % — ABNORMAL HIGH (ref 11.5–15.5)
WBC: 3.7 10*3/uL — ABNORMAL LOW (ref 4.0–10.5)
nRBC: 0.8 % — ABNORMAL HIGH (ref 0.0–0.2)

## 2021-04-23 LAB — MAGNESIUM: Magnesium: 1.7 mg/dL (ref 1.7–2.4)

## 2021-04-23 SURGERY — ESOPHAGOGASTRODUODENOSCOPY (EGD) WITH PROPOFOL
Anesthesia: Monitor Anesthesia Care

## 2021-04-23 MED ORDER — SODIUM CHLORIDE 0.9 % IV SOLN
25.0000 mg | Freq: Once | INTRAVENOUS | Status: AC
  Administered 2021-04-24: 25 mg via INTRAVENOUS
  Filled 2021-04-23 (×2): qty 0.5

## 2021-04-23 MED ORDER — PROPOFOL 10 MG/ML IV BOLUS
INTRAVENOUS | Status: DC | PRN
  Administered 2021-04-23: 30 mg via INTRAVENOUS

## 2021-04-23 MED ORDER — SODIUM CHLORIDE 0.9 % IV SOLN: INTRAVENOUS | Status: DC

## 2021-04-23 MED ORDER — PEG 3350-KCL-NA BICARB-NACL 420 G PO SOLR
4000.0000 mL | Freq: Once | ORAL | Status: AC
  Administered 2021-04-23: 4000 mL via ORAL
  Filled 2021-04-23: qty 4000

## 2021-04-23 MED ORDER — LACTATED RINGERS IV SOLN: INTRAVENOUS | Status: DC

## 2021-04-23 MED ORDER — PROPOFOL 500 MG/50ML IV EMUL
INTRAVENOUS | Status: DC | PRN
  Administered 2021-04-23: 125 ug/kg/min via INTRAVENOUS

## 2021-04-23 MED ORDER — SODIUM CHLORIDE 0.9 % IV SOLN: 1000.0000 mg | Freq: Once | INTRAVENOUS | Status: DC

## 2021-04-23 SURGICAL SUPPLY — 14 items

## 2021-04-23 NOTE — Anesthesia Postprocedure Evaluation (Signed)
Anesthesia Post Note  Patient: [name]  Procedure(s) Performed: ESOPHAGOGASTRODUODENOSCOPY (EGD) WITH PROPOFOL     Patient location during evaluation: PACU Anesthesia Type: MAC Level of consciousness: awake and alert and oriented Pain management: pain level controlled Vital Signs Assessment: post-procedure vital signs reviewed and stable Respiratory status: spontaneous breathing, nonlabored ventilation and respiratory function stable Cardiovascular status: stable and blood pressure returned to baseline Postop Assessment: no apparent nausea or vomiting Anesthetic complications: no   No notable events documented.  Last Vitals:  Vitals:   04/23/21 1220 04/23/21 1230  BP: (!) 104/58 134/75  Pulse: (!) 56 60  Resp: 17 18  Temp:    SpO2: 98% 96%    Last Pain:  Vitals:   04/23/21 1230  TempSrc:   PainSc: 0-No pain                 Nandi Tonnesen A.

## 2021-04-23 NOTE — Anesthesia Procedure Notes (Signed)
Procedure Name: MAC Date/Time: 04/23/2021 12:05 PM Performed by: Claudia Desanctis, CRNA Oxygen Delivery Method: Simple face mask

## 2021-04-23 NOTE — Progress Notes (Signed)
[name] 11:48 AM  Subjective: Patient seen and examined today and has not seen any obvious bleeding has not had any previous GI tests or work-up other than some abdominal cramps denies any GI issues and she says her family history is negative she has no other complaints and is feeling better than she was  Objective: Vital signs stable afebrile no acute distress exam please see preassessment evaluation BUN and creatinine okay hemoglobin increased with transfusion significantly iron deficient  Assessment: Iron deficiency questionable etiology  Plan: Okay to proceed with endoscopy today with anesthesia assistance and pending those findings will need either inpatient or outpatient colonoscopy and as an aside there is a question in the computer chart about HIV and probably per the hospital team that needs to be clarified or at least repeated  [facility]  office [phone] After 5PM or if no answer call [phone]

## 2021-04-23 NOTE — Transfer of Care (Signed)
Immediate Anesthesia Transfer of Care Note  Patient: [name]  Procedure(s) Performed: ESOPHAGOGASTRODUODENOSCOPY (EGD) WITH PROPOFOL  Patient Location: Endoscopy Unit  Anesthesia Type:MAC  Level of Consciousness: drowsy  Airway & Oxygen Therapy: Patient Spontanous Breathing and Patient connected to face mask  Post-op Assessment: Report given to RN and Post -op Vital signs reviewed and stable  Post vital signs: Reviewed and stable  Last Vitals:  Vitals Value Taken Time  BP 99/56 04/23/21 1215  Temp    Pulse 55 04/23/21 1217  Resp 16 04/23/21 1217  SpO2 98 % 04/23/21 1217  Vitals shown include unvalidated device data.  Last Pain:  Vitals:   04/23/21 1124  TempSrc: Oral  PainSc: 0-No pain         Complications: No notable events documented.

## 2021-04-23 NOTE — Progress Notes (Signed)
Progress Note    [name]   RKY:[id number]  DOB: 06/04/1956  DOA: 04/22/2021     1  PCP: Corine Shelter, MD  CC: abd pain  Hospital Course: [name] is a 65 y.o. female with medical history significant of chronic headache, HIV?  Comes to the hospital with complains of headache and abdominal pain.  Patient states she has had headache for about a year and started having slight abdominal discomfort about a month ago.  To help with this pain she has been taking Goody powders at least 2-3 times daily for the past 2 months or so.  Denies taking any other medications at this time.  Admits at times she may notice slight red tinge stool but otherwise no other bleeding.   In the ER patient's hemoglobin was noted to be 4.5, 2 units PRBC transfusion was ordered.  GI was consulted.  Started on PPI twice daily and admitted to the hospital.  Interval History:  Resting in bed comfortably when seen this morning in the ER.  Endorsed her underlying BC powder use and states that she understands this may be likely contributing/causing her anemia and she understands the need to quit.  ROS: Constitutional: negative for chills and fevers, Respiratory: negative for cough, Cardiovascular: negative for chest pain, and Gastrointestinal: negative for abdominal pain  Assessment & Plan:  Microcytic symptomatic anemia IDA Abdominal pain unspecified - Admission hemoglobin 4.5.  Unknown baseline.  Unclear etiology.  She does use Goody powder therefore could be gastritis/ulcer slow bleeding over time versus lower GI bleed.  Never had C-scope in the past. -Tentative plan for EGD today -Received 2 units PRBC on admission.  Repeat hemoglobin is 8.1 g/dL this morning -INFeD ordered for repletion due to low iron stores  Headache - CT head negative.  Suspect secondary to anemia.   -Improved symptoms when seen this morning   HIV?  Per history -HIV antibody ordered  Old records reviewed in assessment of  this patient  Antimicrobials:   DVT prophylaxis: SCDs Start: 04/22/21 1717   Code Status:   Code Status: Full Code Family Communication:   Disposition Plan: Status is: Inpatient  Remains inpatient appropriate because:Ongoing diagnostic testing needed not appropriate for outpatient work up, IV treatments appropriate due to intensity of illness or inability to take PO, and Inpatient level of care appropriate due to severity of illness  Dispo: The patient is from: Home              Anticipated d/c is to: Home              Patient currently is not medically stable to d/c.   Difficult to place patient No  Risk of unplanned readmission score: Unplanned Admission- Pilot do not use: 9.64   Objective: Blood pressure 133/74, pulse (!) 59, temperature 98.1 F (36.7 C), temperature source Oral, resp. rate 17, weight 64.4 kg, SpO2 96 %.  Examination: General appearance: alert, cooperative, and no distress Head: Normocephalic, without obvious abnormality, atraumatic Eyes:  eomi Lungs: clear to auscultation bilaterally Heart: regular rate and rhythm and S1, S2 normal Abdomen: normal findings: bowel sounds normal and soft, non-tender Extremities:  No edema Skin: mobility and turgor normal Neurologic: Grossly normal  Consultants:  GI  Procedures:  EGD, 04/23/2021 -   Data Reviewed: I have personally reviewed following labs and imaging studies Results for orders placed or performed during the hospital encounter of 04/22/21 (from the past 24 hour(s))  Lipase, blood  Status: None   Collection Time: 04/22/21  2:13 PM  Result Value Ref Range   Lipase 33 11 - 51 U/L  Comprehensive metabolic panel     Status: None   Collection Time: 04/22/21  2:13 PM  Result Value Ref Range   Sodium 139 135 - 145 mmol/L   Potassium 3.6 3.5 - 5.1 mmol/L   Chloride 105 98 - 111 mmol/L   CO2 26 22 - 32 mmol/L   Glucose, Bld 89 70 - 99 mg/dL   BUN 8 8 - 23 mg/dL   Creatinine, Ser 3.24 0.44 - 1.00 mg/dL    Calcium 9.0 8.9 - 40.1 mg/dL   Total Protein 7.6 6.5 - 8.1 g/dL   Albumin 3.9 3.5 - 5.0 g/dL   AST 23 15 - 41 U/L   ALT 9 0 - 44 U/L   Alkaline Phosphatase 59 38 - 126 U/L   Total Bilirubin 0.9 0.3 - 1.2 mg/dL   GFR, Estimated >02 >72 mL/min   Anion gap 8 5 - 15  CBC     Status: Abnormal   Collection Time: 04/22/21  2:13 PM  Result Value Ref Range   WBC 4.4 4.0 - 10.5 K/uL   RBC 2.61 (L) 3.87 - 5.11 MIL/uL   Hemoglobin 4.5 (LL) 12.0 - 15.0 g/dL   HCT 53.6 (L) 64.4 - 03.4 %   MCV 66.7 (L) 80.0 - 100.0 fL   MCH 17.2 (L) 26.0 - 34.0 pg   MCHC 25.9 (L) 30.0 - 36.0 g/dL   RDW 74.2 (H) 59.5 - 63.8 %   Platelets 799 (H) 150 - 400 K/uL   nRBC 0.7 (H) 0.0 - 0.2 %  Reticulocytes     Status: Abnormal   Collection Time: 04/22/21  2:32 PM  Result Value Ref Range   Retic Ct Pct 0.9 0.4 - 3.1 %   RBC. 2.62 (L) 3.87 - 5.11 MIL/uL   Retic Count, Absolute 22.8 19.0 - 186.0 K/uL   Immature Retic Fract 23.6 (H) 2.3 - 15.9 %  Type and screen [facility]     Status: None   Collection Time: 04/22/21  3:23 PM  Result Value Ref Range   ABO/RH(D) O POS    Antibody Screen NEG    Sample Expiration 04/25/2021,2359    Unit Number [id number]    Blood Component Type RED CELLS,LR    Unit division 00    Status of Unit ISSUED,FINAL    Transfusion Status OK TO TRANSFUSE    Crossmatch Result Compatible    Unit Number [id number]    Blood Component Type RED CELLS,LR    Unit division 00    Status of Unit ISSUED,FINAL    Transfusion Status OK TO TRANSFUSE    Crossmatch Result      Compatible Performed at [facility], [address] [place], [place] [address]   ABO/Rh     Status: None   Collection Time: 04/22/21  3:29 PM  Result Value Ref Range   ABO/RH(D)      O POS Performed at [facility], [address], [place], [place] [address]   Resp Panel by RT-PCR (Flu A&B, Covid) Nasopharyngeal Swab     Status: None   Collection Time:  04/22/21  4:22 PM   Specimen: Nasopharyngeal Swab; Nasopharyngeal(NP) swabs in vial transport medium  Result Value Ref Range   SARS Coronavirus 2 by RT PCR NEGATIVE NEGATIVE   Influenza A by PCR NEGATIVE NEGATIVE  Influenza B by PCR NEGATIVE NEGATIVE  Prepare RBC (crossmatch)     Status: None   Collection Time: 04/22/21  4:24 PM  Result Value Ref Range   Order Confirmation      ORDER PROCESSED BY BLOOD BANK Performed at [facility], [address], [place], [place] [address]   POC occult blood, ED     Status: None   Collection Time: 04/22/21  4:25 PM  Result Value Ref Range   Fecal Occult Bld NEGATIVE NEGATIVE  Vitamin B12     Status: None   Collection Time: 04/22/21  5:23 PM  Result Value Ref Range   Vitamin B-12 589 180 - 914 pg/mL  Folate     Status: None   Collection Time: 04/22/21  5:23 PM  Result Value Ref Range   Folate 16.2 >5.9 ng/mL  Iron and TIBC     Status: Abnormal   Collection Time: 04/22/21  5:23 PM  Result Value Ref Range   Iron 17 (L) 28 - 170 ug/dL   TIBC 073 (H) 710 - 626 ug/dL   Saturation Ratios 3 (L) 10.4 - 31.8 %   UIBC 605 ug/dL  Ferritin     Status: Abnormal   Collection Time: 04/22/21  5:23 PM  Result Value Ref Range   Ferritin 1 (L) 11 - 307 ng/mL  Ferritin     Status: Abnormal   Collection Time: 04/22/21  5:23 PM  Result Value Ref Range   Ferritin 1 (L) 11 - 307 ng/mL  TSH     Status: None   Collection Time: 04/22/21  5:23 PM  Result Value Ref Range   TSH 3.660 0.350 - 4.500 uIU/mL  Protime-INR     Status: None   Collection Time: 04/22/21  5:23 PM  Result Value Ref Range   Prothrombin Time 14.3 11.4 - 15.2 seconds   INR 1.1 0.8 - 1.2  CBC     Status: Abnormal   Collection Time: 04/23/21  5:00 AM  Result Value Ref Range   WBC 3.7 (L) 4.0 - 10.5 K/uL   RBC 3.70 (L) 3.87 - 5.11 MIL/uL   Hemoglobin 8.1 (L) 12.0 - 15.0 g/dL   HCT 94.8 (L) 54.6 - 27.0 %   MCV 75.1 (L) 80.0 - 100.0 fL   MCH 21.9 (L) 26.0 - 34.0 pg    MCHC 29.1 (L) 30.0 - 36.0 g/dL   RDW 35.0 (H) 09.3 - 81.8 %   Platelets 496 (H) 150 - 400 K/uL   nRBC 0.8 (H) 0.0 - 0.2 %  Comprehensive metabolic panel     Status: Abnormal   Collection Time: 04/23/21  5:00 AM  Result Value Ref Range   Sodium 138 135 - 145 mmol/L   Potassium 3.5 3.5 - 5.1 mmol/L   Chloride 104 98 - 111 mmol/L   CO2 25 22 - 32 mmol/L   Glucose, Bld 147 (H) 70 - 99 mg/dL   BUN 6 (L) 8 - 23 mg/dL   Creatinine, Ser 2.99 0.44 - 1.00 mg/dL   Calcium 8.5 (L) 8.9 - 10.3 mg/dL   Total Protein 7.1 6.5 - 8.1 g/dL   Albumin 3.5 3.5 - 5.0 g/dL   AST 22 15 - 41 U/L   ALT 8 0 - 44 U/L   Alkaline Phosphatase 62 38 - 126 U/L   Total Bilirubin 1.6 (H) 0.3 - 1.2 mg/dL   GFR, Estimated >37 >16 mL/min   Anion gap 9 5 - 15  Magnesium     Status: None   Collection Time: 04/23/21  5:00 AM  Result Value Ref Range   Magnesium 1.7 1.7 - 2.4 mg/dL  CBG monitoring, ED     Status: Abnormal   Collection Time: 04/23/21  6:19 AM  Result Value Ref Range   Glucose-Capillary 130 (H) 70 - 99 mg/dL  Urinalysis, Routine w reflex microscopic Urine, Clean Catch     Status: Abnormal   Collection Time: 04/23/21  9:16 AM  Result Value Ref Range   Color, Urine YELLOW YELLOW   APPearance CLEAR CLEAR   Specific Gravity, Urine 1.031 (H) 1.005 - 1.030   pH 5.0 5.0 - 8.0   Glucose, UA NEGATIVE NEGATIVE mg/dL   Hgb urine dipstick NEGATIVE NEGATIVE   Bilirubin Urine NEGATIVE NEGATIVE   Ketones, ur NEGATIVE NEGATIVE mg/dL   Protein, ur NEGATIVE NEGATIVE mg/dL   Nitrite NEGATIVE NEGATIVE   Leukocytes,Ua NEGATIVE NEGATIVE    Recent Results (from the past 240 hour(s))  Resp Panel by RT-PCR (Flu A&B, Covid) Nasopharyngeal Swab     Status: None   Collection Time: 04/22/21  4:22 PM   Specimen: Nasopharyngeal Swab; Nasopharyngeal(NP) swabs in vial transport medium  Result Value Ref Range Status   SARS Coronavirus 2 by RT PCR NEGATIVE NEGATIVE Final    Comment: (NOTE) SARS-CoV-2 target nucleic acids are  NOT DETECTED.  The SARS-CoV-2 RNA is generally detectable in upper respiratory specimens during the acute phase of infection. The lowest concentration of SARS-CoV-2 viral copies this assay can detect is 138 copies/mL. A negative result does not preclude SARS-Cov-2 infection and should not be used as the sole basis for treatment or other patient management decisions. A negative result may occur with  improper specimen collection/handling, submission of specimen other than nasopharyngeal swab, presence of viral mutation(s) within the areas targeted by this assay, and inadequate number of viral copies(<138 copies/mL). A negative result must be combined with clinical observations, patient history, and epidemiological information. The expected result is Negative.  Fact Sheet for Patients:  BloggerCourse.comhttps://www.fda.gov/media/152166/download  Fact Sheet for Healthcare Providers:  SeriousBroker.ithttps://www.fda.gov/media/152162/download  This test is no t yet approved or cleared by the [place] FDA and  has been authorized for detection and/or diagnosis of SARS-CoV-2 by FDA under an Emergency Use Authorization (EUA). This EUA will remain  in effect (meaning this test can be used) for the duration of the COVID-19 declaration under Section 564(b)(1) of the Act, 21 U.S.C.section 360bbb-3(b)(1), unless the authorization is terminated  or revoked sooner.       Influenza A by PCR NEGATIVE NEGATIVE Final   Influenza B by PCR NEGATIVE NEGATIVE Final    Comment: (NOTE) The Xpert Xpress SARS-CoV-2/FLU/RSV plus assay is intended as an aid in the diagnosis of influenza from Nasopharyngeal swab specimens and should not be used as a sole basis for treatment. Nasal washings and aspirates are unacceptable for Xpert Xpress SARS-CoV-2/FLU/RSV testing.  Fact Sheet for Patients: BloggerCourse.comhttps://www.fda.gov/media/152166/download  Fact Sheet for Healthcare Providers: SeriousBroker.ithttps://www.fda.gov/media/152162/download  This test is not  yet approved or cleared by the [place] FDA and has been authorized for detection and/or diagnosis of SARS-CoV-2 by FDA under an Emergency Use Authorization (EUA). This EUA will remain in effect (meaning this test can be used) for the duration of the COVID-19 declaration under Section 564(b)(1) of the Act, 21 U.S.C. section 360bbb-3(b)(1), unless the authorization is terminated or revoked.  Performed at [facility], [address] [place], [place] [id number]  Radiology Studies: CT Head Wo Contrast  Result Date: 04/22/2021 CLINICAL DATA:  New or worsening headache in a 65 year old female. EXAM: CT HEAD WITHOUT CONTRAST TECHNIQUE: Contiguous axial images were obtained from the base of the skull through the vertex without intravenous contrast. COMPARISON:  December 03, 2008 and prior MRI of the brain. FINDINGS: Brain: No evidence of acute infarction, hemorrhage, hydrocephalus, extra-axial collection or mass lesion/mass effect. Vascular: No hyperdense vessel or unexpected calcification. Skull: Normal. Negative for fracture or focal lesion. Sinuses/Orbits: Visualized paranasal sinuses and orbits are unremarkable. Other: None. IMPRESSION: No acute intracranial abnormality. Electronically Signed   By: Donzetta Kohut M.D.   On: 04/22/2021 15:10   CT ABDOMEN PELVIS W CONTRAST  Result Date: 04/22/2021 CLINICAL DATA:  Acute nonlocalized abdominal pain.  Anemia. EXAM: CT ABDOMEN AND PELVIS WITH CONTRAST TECHNIQUE: Multidetector CT imaging of the abdomen and pelvis was performed using the standard protocol following bolus administration of intravenous contrast. CONTRAST:  OMNIPAQUE IOHEXOL 300 MG/ML  SOLN COMPARISON:  None. FINDINGS: Lower chest: Streaky opacities in both lower lobes favor atelectasis or scarring. There is no pleural effusion. Heart size normal. Hepatobiliary: Diffusely decreased hepatic density consistent with steatosis. No focal liver abnormality is seen. No  gallstone or pericholecystic inflammation. There is no biliary dilatation. Pancreas: No ductal dilatation or inflammation. Spleen: Normal in size without focal abnormality. Adrenals/Urinary Tract: No adrenal nodules. No hydronephrosis. Suspected punctate nonobstructing stone in the upper right kidney. Homogeneous renal enhancement. Symmetric renal excretion on delayed phase imaging no focal renal lesion. Physiologically distended urinary bladder without wall thickening. Stomach/Bowel: Tiny hiatal hernia. The stomach is decompressed. There is no small bowel obstruction or inflammatory change. Normal appendix tentatively visualized. There is no appendicitis. Small to moderate colonic stool burden. No colonic inflammation or wall thickening. Vascular/Lymphatic: Normal caliber abdominal aorta. Patent portal vein. No acute vascular findings. No bulky abdominopelvic adenopathy. No definite enlarged abdominopelvic lymph nodes. Reproductive: Retroverted uterus.  No adnexal mass. Other: No ascites or free air. Tiny fat containing umbilical hernia. Musculoskeletal: Mild degenerative change in the spine. Vacuum phenomena at L3-L4. IMPRESSION: 1. No acute abnormality or explanation for abdominal pain. 2. Hepatic steatosis. 3. Suspected punctate nonobstructing stone in the upper right kidney. Electronically Signed   By: Narda Rutherford M.D.   On: 04/22/2021 20:00   CT ABDOMEN PELVIS W CONTRAST  Final Result    CT Head Wo Contrast  Final Result      Scheduled Meds:  [MAR Hold] pantoprazole (PROTONIX) IV  40 mg Intravenous Q12H   PRN Meds: [MAR Hold] acetaminophen, [MAR Hold] bisacodyl, [MAR Hold] dextromethorphan-guaiFENesin, [MAR Hold] HYDROcodone-acetaminophen, [MAR Hold] ipratropium-albuterol, [MAR Hold] ondansetron **OR** [MAR Hold] ondansetron (ZOFRAN) IV, [MAR Hold] senna-docusate Continuous Infusions:  sodium chloride     dextrose 5 % and 0.45% NaCl 75 mL/hr at 04/22/21 1904   [MAR Hold] iron dextran  (INFED/DEXFERRUM) infusion     Followed by   [ZOX Hold] iron dextran (INFED/DEXFERRUM) infusion     lactated ringers       LOS: 1 day  Time spent: Greater than 50% of the 35 minute visit was spent in counseling/coordination of care for the patient as laid out in the A&P.   Lewie Chamber, MD [facility] 04/23/2021, 12:15 PM

## 2021-04-23 NOTE — Op Note (Signed)
[name] Patient Name: [name] Procedure Date: 04/23/2021 MRN: [id number] Attending MD: [name] , MD Date of Birth: [name] CSN: [id number] Age: 65 Admit Type: Inpatient Procedure:                Upper GI endoscopy Indications:              Iron deficiency anemia Providers:                Marcia Rigger, MD, Marcia Husk RN, RN, [name], [name], Technician, [name] Referring MD:              Medicines:                Propofol total dose 110 mg IV Complications:            No immediate complications. Estimated Blood Loss:     Estimated blood loss: none. Procedure:                Pre-Anesthesia Assessment:                           - Prior to the procedure, a History and Physical                            was performed, and patient medications and                            allergies were reviewed. The patient's tolerance of                            previous anesthesia was also reviewed. The risks                            and benefits of the procedure and the sedation                            options and risks were discussed with the patient.                            All questions were answered, and informed consent                            was obtained. Prior Anticoagulants: The patient has                            taken no previous anticoagulant or antiplatelet                            agents except for aspirin. ASA Grade Assessment: II                            - A patient with mild systemic disease. After  reviewing the risks and benefits, the patient was                            deemed in satisfactory condition to undergo the                            procedure.                           After obtaining informed consent, the endoscope was                            passed under direct vision. Throughout the                            procedure, the patient's blood  pressure, pulse, and                            oxygen saturations were monitored continuously. The                            [id number] ([id number] Olympus gastroscope was                            introduced through the mouth, and advanced to the                            third part of duodenum. The upper GI endoscopy was                            accomplished without difficulty. The patient                            tolerated the procedure well. Scope In: Scope Out: Findings:      A tiny hiatal hernia was present.      A few localized diminutive erosions with no stigmata of recent bleeding       were found in the gastric body.      The duodenal bulb, first portion of the duodenum, second portion of the       duodenum and third portion of the duodenum were normal.      The exam was otherwise without abnormality. Impression:               - Tiny hiatal hernia.                           - Erosive gastropathy with no stigmata of recent                            bleeding.                           - Normal duodenal bulb, first portion of the                            duodenum,  second portion of the duodenum and third                            portion of the duodenum.                           - The examination was otherwise normal.                           - No specimens collected. Moderate Sedation:      Not Applicable - Patient had care per Anesthesia. Recommendation:           - Clear liquid diet today unless she would like to                            proceed with colonoscopy as an outpatient in which                            case she can eat and probably go home.                           - Continue present medications.                           - Return to GI clinic PRN.                           - Telephone GI clinic if symptomatic PRN.                           - Perform a colonoscopy tomorrow. Or as outpatient                            per patient  preference Procedure Code(s):        --- Professional ---                           [id number], Esophagogastroduodenoscopy, flexible,                            transoral; diagnostic, including collection of                            specimen(s) by brushing or washing, when performed                            (separate procedure) Diagnosis Code(s):        --- Professional ---                           K44.9, Diaphragmatic hernia without obstruction or                            gangrene  K31.89, Other diseases of stomach and duodenum                           D50.9, Iron deficiency anemia, unspecified CPT copyright 2019 American Medical Association. All rights reserved. The codes documented in this report are preliminary and upon coder review may  be revised to meet current compliance requirements. Marcia RiggerMarc Hershell Brandl, MD 04/23/2021 12:20:17 PM This report has been signed electronically. Number of Addenda: 0

## 2021-04-23 NOTE — Anesthesia Preprocedure Evaluation (Addendum)
Anesthesia Evaluation  Patient identified by MRN, date of birth, ID band Patient awake  General Assessment Comment:Oriented to person  Reviewed: Allergy & Precautions, NPO status , Patient's Chart, lab work & pertinent test results  Airway Mallampati: II  TM Distance: >3 FB Neck ROM: Full    Dental no notable dental hx. (+) Dental Advisory Given, Loose,    Pulmonary neg pulmonary ROS, Current Smoker and Patient abstained from smoking.,    Pulmonary exam normal breath sounds clear to auscultation       Cardiovascular negative cardio ROS Normal cardiovascular exam Rhythm:Regular Rate:Normal     Neuro/Psych negative neurological ROS  negative psych ROS   GI/Hepatic Neg liver ROS, Cholelithiasis GI Bleed   Endo/Other  negative endocrine ROS  Renal/GU negative Renal ROS  negative genitourinary   Musculoskeletal negative musculoskeletal ROS (+)   Abdominal   Peds  Hematology  (+) anemia , HIV, Patient denies HIV status   Anesthesia Other Findings   Reproductive/Obstetrics                            Anesthesia Physical Anesthesia Plan  ASA: 3  Anesthesia Plan: MAC   Post-op Pain Management:    Induction: Intravenous  PONV Risk Score and Plan: 1 and Propofol infusion and Treatment may vary due to age or medical condition  Airway Management Planned: Natural Airway and Nasal Cannula  Additional Equipment:   Intra-op Plan:   Post-operative Plan:   Informed Consent: I have reviewed the patients History and Physical, chart, labs and discussed the procedure including the risks, benefits and alternatives for the proposed anesthesia with the patient or authorized representative who has indicated his/her understanding and acceptance.     Dental advisory given  Plan Discussed with:   Anesthesia Plan Comments: (Abdominal pain anemia for EGD)       Anesthesia Quick Evaluation

## 2021-04-24 ENCOUNTER — Encounter (HOSPITAL_COMMUNITY): Payer: Self-pay | Admitting: Internal Medicine

## 2021-04-24 ENCOUNTER — Inpatient Hospital Stay (HOSPITAL_COMMUNITY): Payer: Medicaid Other | Admitting: Anesthesiology

## 2021-04-24 ENCOUNTER — Encounter (HOSPITAL_COMMUNITY)
Admission: EM | Disposition: A | Discharge status: Home / Self Care | Payer: Self-pay | Source: Home / Self Care | Attending: Internal Medicine

## 2021-04-24 ENCOUNTER — Other Ambulatory Visit: Payer: Self-pay

## 2021-04-24 DIAGNOSIS — D509 Iron deficiency anemia, unspecified: Secondary | ICD-10-CM | POA: Diagnosis not present

## 2021-04-24 DIAGNOSIS — K922 Gastrointestinal hemorrhage, unspecified: Secondary | ICD-10-CM | POA: Diagnosis not present

## 2021-04-24 HISTORY — PX: COLONOSCOPY WITH PROPOFOL: SHX5780

## 2021-04-24 LAB — CBC WITH DIFFERENTIAL/PLATELET
Abs Immature Granulocytes: 0.02 10*3/uL (ref 0.00–0.07)
Basophils Absolute: 0 10*3/uL (ref 0.0–0.1)
Basophils Relative: 1 %
Eosinophils Absolute: 0.1 10*3/uL (ref 0.0–0.5)
Eosinophils Relative: 2 %
HCT: 28.2 % — ABNORMAL LOW (ref 36.0–46.0)
Hemoglobin: 8.2 g/dL — ABNORMAL LOW (ref 12.0–15.0)
Immature Granulocytes: 0 %
Lymphocytes Relative: 25 %
Lymphs Abs: 1.1 10*3/uL (ref 0.7–4.0)
MCH: 22 pg — ABNORMAL LOW (ref 26.0–34.0)
MCHC: 29.1 g/dL — ABNORMAL LOW (ref 30.0–36.0)
MCV: 75.6 fL — ABNORMAL LOW (ref 80.0–100.0)
Monocytes Absolute: 0.4 10*3/uL (ref 0.1–1.0)
Monocytes Relative: 9 %
Neutro Abs: 2.8 10*3/uL (ref 1.7–7.7)
Neutrophils Relative %: 63 %
Platelets: 490 10*3/uL — ABNORMAL HIGH (ref 150–400)
RBC: 3.73 MIL/uL — ABNORMAL LOW (ref 3.87–5.11)
RDW: 31.1 % — ABNORMAL HIGH (ref 11.5–15.5)
WBC: 4.5 10*3/uL (ref 4.0–10.5)
nRBC: 0 % (ref 0.0–0.2)

## 2021-04-24 LAB — MAGNESIUM: Magnesium: 1.6 mg/dL — ABNORMAL LOW (ref 1.7–2.4)

## 2021-04-24 LAB — GLUCOSE, CAPILLARY
Glucose-Capillary: 116 mg/dL — ABNORMAL HIGH (ref 70–99)
Glucose-Capillary: 94 mg/dL (ref 70–99)

## 2021-04-24 SURGERY — COLONOSCOPY WITH PROPOFOL
Anesthesia: Monitor Anesthesia Care

## 2021-04-24 MED ORDER — PANTOPRAZOLE SODIUM 40 MG PO TBEC
40.0000 mg | DELAYED_RELEASE_TABLET | Freq: Every day | ORAL | Status: DC
  Administered 2021-04-24 – 2021-04-25 (×2): 40 mg via ORAL
  Filled 2021-04-24 (×2): qty 1

## 2021-04-24 MED ORDER — PROPOFOL 1000 MG/100ML IV EMUL
INTRAVENOUS | Status: AC
  Filled 2021-04-24: qty 100

## 2021-04-24 MED ORDER — PHENYLEPHRINE 40 MCG/ML (10ML) SYRINGE FOR IV PUSH (FOR BLOOD PRESSURE SUPPORT)
PREFILLED_SYRINGE | INTRAVENOUS | Status: DC | PRN
  Administered 2021-04-24: 120 ug via INTRAVENOUS

## 2021-04-24 MED ORDER — PROMETHAZINE HCL 25 MG/ML IJ SOLN: 6.2500 mg | INTRAMUSCULAR | Status: DC | PRN

## 2021-04-24 MED ORDER — PROPOFOL 10 MG/ML IV BOLUS
INTRAVENOUS | Status: DC | PRN
  Administered 2021-04-24: 50 mg via INTRAVENOUS

## 2021-04-24 MED ORDER — SODIUM CHLORIDE 0.9 % IV SOLN: INTRAVENOUS | Status: DC

## 2021-04-24 MED ORDER — PROPOFOL 500 MG/50ML IV EMUL
INTRAVENOUS | Status: DC | PRN
  Administered 2021-04-24: 150 ug/kg/min via INTRAVENOUS

## 2021-04-24 MED ORDER — LACTATED RINGERS IV SOLN: Freq: Once | INTRAVENOUS | Status: AC

## 2021-04-24 MED ORDER — EPHEDRINE SULFATE-NACL 50-0.9 MG/10ML-% IV SOSY
PREFILLED_SYRINGE | INTRAVENOUS | Status: DC | PRN
  Administered 2021-04-24: 10 mg via INTRAVENOUS

## 2021-04-24 SURGICAL SUPPLY — 21 items

## 2021-04-24 NOTE — Op Note (Signed)
[facility] Patient Name: [name] Procedure Date: 04/24/2021 MRN: [id number] Attending MD: Vida Rigger , MD Date of Birth: 1956-08-22 CSN: [id number] Age: 65 Admit Type: Outpatient Procedure:                Colonoscopy Indications:              This is the patient's first colonoscopy, Iron                            deficiency anemia Providers:                Vida Rigger, MD, Susann Givens, Brooke Person,                            Brion Aliment, Technician, Helayne Seminole Referring MD:              Medicines:                Propofol total dose 350 mg IV Complications:            No immediate complications. Estimated Blood Loss:     Estimated blood loss: none. Procedure:                Pre-Anesthesia Assessment:                           - Prior to the procedure, a History and Physical                            was performed, and patient medications and                            allergies were reviewed. The patient's tolerance of                            previous anesthesia was also reviewed. The risks                            and benefits of the procedure and the sedation                            options and risks were discussed with the patient.                            All questions were answered, and informed consent                            was obtained. Prior Anticoagulants: The patient has                            taken no previous anticoagulant or antiplatelet                            agents except for aspirin. ASA Grade Assessment: II                            -  A patient with mild systemic disease. After                            reviewing the risks and benefits, the patient was                            deemed in satisfactory condition to undergo the                            procedure.                           After obtaining informed consent, the colonoscope                            was passed under direct vision. Throughout the                             procedure, the patient's blood pressure, pulse, and                            oxygen saturations were monitored continuously. The                            [id number]-[id number] ([id number] Olympus pediatric colonscope                            was introduced through the anus and advanced to the                            the cecum, identified by appendiceal orifice and                            ileocecal valve. The ileocecal valve, appendiceal                            orifice, and rectum were photographed. The                            colonoscopy was performed without difficulty. The                            patient tolerated the procedure well. The quality                            of the bowel preparation was fair. Scope In: 12:48:03 PM Scope Out: 1:03:38 PM Scope Withdrawal Time: 0 hours 10 minutes 32 seconds  Total Procedure Duration: 0 hours 15 minutes 35 seconds  Findings:      A few small-mouthed diverticula were found in the ascending colon.      A small amount of semi-solid stool was found in the rectum, in the       sigmoid colon, in the descending colon and at the splenic flexure,       interfering with visualization. Lavage of the area was performed  using a       large amount of sterile water, resulting in clearance with fair       visualization.      The exam was otherwise without abnormality. Impression:               - Preparation of the colon was fair.                           - Diverticulosis in the ascending colon.                           - Stool in the rectum, in the sigmoid colon, in the                            descending colon and at the splenic flexure.                           - The examination was otherwise normal.                           - No specimens collected. Moderate Sedation:      Not Applicable - Patient had care per Anesthesia. Recommendation:           - Soft diet today.                           - Continue present  medications.                           - Repeat colonoscopy in 5-10 years for screening                            purposes.                           - Return to GI office PRN.                           - Telephone GI clinic if symptomatic PRN. Procedure Code(s):        --- Professional ---                           [phone], Colonoscopy, flexible; diagnostic, including                            collection of specimen(s) by brushing or washing,                            when performed (separate procedure) Diagnosis Code(s):        --- Professional ---                           D50.9, Iron deficiency anemia, unspecified                           K57.30, Diverticulosis of large intestine without  perforation or abscess without bleeding CPT copyright 2019 [employer]. All rights reserved. The codes documented in this report are preliminary and upon coder review may  be revised to meet current compliance requirements. Vida Rigger, MD 04/24/2021 1:16:35 PM This report has been signed electronically. Number of Addenda: 0

## 2021-04-24 NOTE — Anesthesia Preprocedure Evaluation (Addendum)
Anesthesia Evaluation  Patient identified by MRN, date of birth, ID band Patient awake    Reviewed: Allergy & Precautions, NPO status , Patient's Chart, lab work & pertinent test results  History of Anesthesia Complications Negative for: history of anesthetic complications  Airway Mallampati: II  TM Distance: >3 FB Neck ROM: Full    Dental  (+) Missing,    Pulmonary Current Smoker,    Pulmonary exam normal        Cardiovascular negative cardio ROS Normal cardiovascular exam     Neuro/Psych negative neurological ROS  negative psych ROS   GI/Hepatic Neg liver ROS, GIB   Endo/Other  negative endocrine ROS  Renal/GU negative Renal ROS  negative genitourinary   Musculoskeletal negative musculoskeletal ROS (+)   Abdominal   Peds  Hematology  (+) anemia , Hgb 8.2 (Hgb 4.5 on admission), question of HIV in history   Anesthesia Other Findings Day of surgery medications reviewed with patient.  Reproductive/Obstetrics negative OB ROS                           Anesthesia Physical Anesthesia Plan  ASA: 2  Anesthesia Plan: MAC   Post-op Pain Management:    Induction:   PONV Risk Score and Plan: Treatment may vary due to age or medical condition and Propofol infusion  Airway Management Planned: Natural Airway and Nasal Cannula  Additional Equipment: None  Intra-op Plan:   Post-operative Plan:   Informed Consent: I have reviewed the patients History and Physical, chart, labs and discussed the procedure including the risks, benefits and alternatives for the proposed anesthesia with the patient or authorized representative who has indicated his/her understanding and acceptance.       Plan Discussed with: CRNA  Anesthesia Plan Comments:        Anesthesia Quick Evaluation

## 2021-04-24 NOTE — ED Notes (Signed)
BS 76.

## 2021-04-24 NOTE — Transfer of Care (Signed)
Immediate Anesthesia Transfer of Care Note  Patient: [name]  Procedure(s) Performed: COLONOSCOPY WITH PROPOFOL  Patient Location: PACU and Endoscopy Unit  Anesthesia Type:MAC  Level of Consciousness: awake, alert  and oriented  Airway & Oxygen Therapy: Patient Spontanous Breathing and Patient connected to face mask  Post-op Assessment: Report given to RN and Post -op Vital signs reviewed and stable  Post vital signs: Reviewed and stable  Last Vitals:  Vitals Value Taken Time  BP    Temp    Pulse 67 04/24/21 1308  Resp 15 04/24/21 1308  SpO2 100 % 04/24/21 1308  Vitals shown include unvalidated device data.  Last Pain:  Vitals:   04/24/21 1136  TempSrc: Tympanic  PainSc: 0-No pain         Complications: No notable events documented.

## 2021-04-24 NOTE — ED Notes (Signed)
Alda Berthold, RN was sent a secure message in regards to admission bed assignment to [phone]

## 2021-04-24 NOTE — Progress Notes (Signed)
Progress Note    [name]   UXN:[id number]  DOB: 11-17-1955  DOA: 04/22/2021     2  PCP: Corine Shelter, MD  CC: abd pain  Hospital Course: [name] is a 65 y.o. female with medical history significant of chronic headache, HIV?  Comes to the hospital with complains of headache and abdominal pain.  Patient states she has had headache for about a year and started having slight abdominal discomfort about a month ago.  To help with this pain she has been taking Goody powders at least 2-3 times daily for the past 2 months or so.  Denies taking any other medications at this time.  Admits at times she may notice slight red tinge stool but otherwise no other bleeding.   In the ER patient's hemoglobin was noted to be 4.5, 2 units PRBC transfusion was ordered.  GI was consulted.  Started on PPI twice daily and admitted to the hospital.  Interval History:  No events overnight.  Seen in the ER this morning still awaiting a room.  Denies any significant bleeding or dark stools.  She has decided to undergo inpatient colonoscopy planned for today.  ROS: Constitutional: negative for chills and fevers, Respiratory: negative for cough, Cardiovascular: negative for chest pain, and Gastrointestinal: negative for abdominal pain  Assessment & Plan:  Microcytic symptomatic anemia IDA Abdominal pain unspecified - Admission hemoglobin 4.5.  Unknown baseline.  Unclear etiology.  She does use Goody powder therefore could be gastritis/ulcer slow bleeding over time versus lower GI bleed.  Never had C-scope in the past. -EGD on 04/23/2021 showed erosive gastropathy - Plan is for colonoscopy today -Received 2 units PRBC on admission. -INFeD ordered for repletion due to low iron stores  Headache - CT head negative.  Suspect secondary to anemia.   -Improved symptoms when seen this morning   HIV?  Per history -HIV antibody ordered  Old records reviewed in assessment of this  patient  Antimicrobials:   DVT prophylaxis: SCDs Start: 04/22/21 1717   Code Status:   Code Status: Full Code Family Communication:   Disposition Plan: Status is: Inpatient  Remains inpatient appropriate because:Ongoing diagnostic testing needed not appropriate for outpatient work up, IV treatments appropriate due to intensity of illness or inability to take PO, and Inpatient level of care appropriate due to severity of illness  Dispo: The patient is from: Home              Anticipated d/c is to: Home              Patient currently is not medically stable to d/c.   Difficult to place patient No  Risk of unplanned readmission score: Unplanned Admission- Pilot do not use: 9.47   Objective: Blood pressure (!) 149/76, pulse (!) 54, temperature 97.8 F (36.6 C), temperature source Oral, resp. rate 16, height 5\' 4"  (1.626 m), weight 64 kg, SpO2 100 %.  Examination: General appearance: alert, cooperative, and no distress Head: Normocephalic, without obvious abnormality, atraumatic Eyes:  eomi Lungs: clear to auscultation bilaterally Heart: regular rate and rhythm and S1, S2 normal Abdomen: normal findings: bowel sounds normal and soft, non-tender Extremities:  No edema Skin: mobility and turgor normal Neurologic: Grossly normal  Consultants:  GI  Procedures:  EGD, 04/23/2021  Colonoscopy, 04/24/2021  Data Reviewed: I have personally reviewed following labs and imaging studies Results for orders placed or performed during the hospital encounter of 04/22/21 (from the past 24 hour(s))  BLOOD TRANSFUSION REPORT -  SCANNED     Status: None   Collection Time: 04/23/21  2:58 PM   Narrative   Ordered by an unspecified provider.  CBG monitoring, ED     Status: None   Collection Time: 04/23/21  6:40 PM  Result Value Ref Range   Glucose-Capillary 71 70 - 99 mg/dL  CBC with Differential/Platelet     Status: Abnormal   Collection Time: 04/24/21  8:15 AM  Result Value Ref Range   WBC  4.5 4.0 - 10.5 K/uL   RBC 3.73 (L) 3.87 - 5.11 MIL/uL   Hemoglobin 8.2 (L) 12.0 - 15.0 g/dL   HCT 95.228.2 (L) 84.136.0 - 32.446.0 %   MCV 75.6 (L) 80.0 - 100.0 fL   MCH 22.0 (L) 26.0 - 34.0 pg   MCHC 29.1 (L) 30.0 - 36.0 g/dL   RDW 40.131.1 (H) 02.711.5 - 25.315.5 %   Platelets 490 (H) 150 - 400 K/uL   nRBC 0.0 0.0 - 0.2 %   Neutrophils Relative % 63 %   Neutro Abs 2.8 1.7 - 7.7 K/uL   Lymphocytes Relative 25 %   Lymphs Abs 1.1 0.7 - 4.0 K/uL   Monocytes Relative 9 %   Monocytes Absolute 0.4 0.1 - 1.0 K/uL   Eosinophils Relative 2 %   Eosinophils Absolute 0.1 0.0 - 0.5 K/uL   Basophils Relative 1 %   Basophils Absolute 0.0 0.0 - 0.1 K/uL   Immature Granulocytes 0 %   Abs Immature Granulocytes 0.02 0.00 - 0.07 K/uL    Recent Results (from the past 240 hour(s))  Resp Panel by RT-PCR (Flu A&B, Covid) Nasopharyngeal Swab     Status: None   Collection Time: 04/22/21  4:22 PM   Specimen: Nasopharyngeal Swab; Nasopharyngeal(NP) swabs in vial transport medium  Result Value Ref Range Status   SARS Coronavirus 2 by RT PCR NEGATIVE NEGATIVE Final    Comment: (NOTE) SARS-CoV-2 target nucleic acids are NOT DETECTED.  The SARS-CoV-2 RNA is generally detectable in upper respiratory specimens during the acute phase of infection. The lowest concentration of SARS-CoV-2 viral copies this assay can detect is 138 copies/mL. A negative result does not preclude SARS-Cov-2 infection and should not be used as the sole basis for treatment or other patient management decisions. A negative result may occur with  improper specimen collection/handling, submission of specimen other than nasopharyngeal swab, presence of viral mutation(s) within the areas targeted by this assay, and inadequate number of viral copies(<138 copies/mL). A negative result must be combined with clinical observations, patient history, and epidemiological information. The expected result is Negative.  Fact Sheet for Patients:   BloggerCourse.comhttps://www.fda.gov/media/152166/download  Fact Sheet for Healthcare Providers:  SeriousBroker.ithttps://www.fda.gov/media/152162/download  This test is no t yet approved or cleared by the [place] FDA and  has been authorized for detection and/or diagnosis of SARS-CoV-2 by FDA under an Emergency Use Authorization (EUA). This EUA will remain  in effect (meaning this test can be used) for the duration of the COVID-19 declaration under Section 564(b)(1) of the Act, 21 U.S.C.section 360bbb-3(b)(1), unless the authorization is terminated  or revoked sooner.       Influenza A by PCR NEGATIVE NEGATIVE Final   Influenza B by PCR NEGATIVE NEGATIVE Final    Comment: (NOTE) The Xpert Xpress SARS-CoV-2/FLU/RSV plus assay is intended as an aid in the diagnosis of influenza from Nasopharyngeal swab specimens and should not be used as a sole basis for treatment. Nasal washings and aspirates are unacceptable for Xpert Xpress SARS-CoV-2/FLU/RSV testing.  Fact Sheet for Patients: BloggerCourse.com  Fact Sheet for Healthcare Providers: SeriousBroker.it  This test is not yet approved or cleared by the [place] FDA and has been authorized for detection and/or diagnosis of SARS-CoV-2 by FDA under an Emergency Use Authorization (EUA). This EUA will remain in effect (meaning this test can be used) for the duration of the COVID-19 declaration under Section 564(b)(1) of the Act, 21 U.S.C. section 360bbb-3(b)(1), unless the authorization is terminated or revoked.  Performed at [facility], [address] [place], [place] [address]      Radiology Studies: CT Head Wo Contrast  Result Date: 04/22/2021 CLINICAL DATA:  New or worsening headache in a 65 year old female. EXAM: CT HEAD WITHOUT CONTRAST TECHNIQUE: Contiguous axial images were obtained from the base of the skull through the vertex without intravenous contrast. COMPARISON:   December 03, 2008 and prior MRI of the brain. FINDINGS: Brain: No evidence of acute infarction, hemorrhage, hydrocephalus, extra-axial collection or mass lesion/mass effect. Vascular: No hyperdense vessel or unexpected calcification. Skull: Normal. Negative for fracture or focal lesion. Sinuses/Orbits: Visualized paranasal sinuses and orbits are unremarkable. Other: None. IMPRESSION: No acute intracranial abnormality. Electronically Signed   By: Donzetta Kohut M.D.   On: 04/22/2021 15:10   CT ABDOMEN PELVIS W CONTRAST  Result Date: 04/22/2021 CLINICAL DATA:  Acute nonlocalized abdominal pain.  Anemia. EXAM: CT ABDOMEN AND PELVIS WITH CONTRAST TECHNIQUE: Multidetector CT imaging of the abdomen and pelvis was performed using the standard protocol following bolus administration of intravenous contrast. CONTRAST:  OMNIPAQUE IOHEXOL 300 MG/ML  SOLN COMPARISON:  None. FINDINGS: Lower chest: Streaky opacities in both lower lobes favor atelectasis or scarring. There is no pleural effusion. Heart size normal. Hepatobiliary: Diffusely decreased hepatic density consistent with steatosis. No focal liver abnormality is seen. No gallstone or pericholecystic inflammation. There is no biliary dilatation. Pancreas: No ductal dilatation or inflammation. Spleen: Normal in size without focal abnormality. Adrenals/Urinary Tract: No adrenal nodules. No hydronephrosis. Suspected punctate nonobstructing stone in the upper right kidney. Homogeneous renal enhancement. Symmetric renal excretion on delayed phase imaging no focal renal lesion. Physiologically distended urinary bladder without wall thickening. Stomach/Bowel: Tiny hiatal hernia. The stomach is decompressed. There is no small bowel obstruction or inflammatory change. Normal appendix tentatively visualized. There is no appendicitis. Small to moderate colonic stool burden. No colonic inflammation or wall thickening. Vascular/Lymphatic: Normal caliber abdominal aorta. Patent  portal vein. No acute vascular findings. No bulky abdominopelvic adenopathy. No definite enlarged abdominopelvic lymph nodes. Reproductive: Retroverted uterus.  No adnexal mass. Other: No ascites or free air. Tiny fat containing umbilical hernia. Musculoskeletal: Mild degenerative change in the spine. Vacuum phenomena at L3-L4. IMPRESSION: 1. No acute abnormality or explanation for abdominal pain. 2. Hepatic steatosis. 3. Suspected punctate nonobstructing stone in the upper right kidney. Electronically Signed   By: Narda Rutherford M.D.   On: 04/22/2021 20:00   CT ABDOMEN PELVIS W CONTRAST  Final Result    CT Head Wo Contrast  Final Result      Scheduled Meds:  pantoprazole  40 mg Oral Daily   PRN Meds: acetaminophen, bisacodyl, dextromethorphan-guaiFENesin, HYDROcodone-acetaminophen, ipratropium-albuterol, ondansetron **OR** ondansetron (ZOFRAN) IV, promethazine, senna-docusate Continuous Infusions:  dextrose 5 % and 0.45% NaCl 75 mL/hr at 04/24/21 0848   iron dextran (INFED/DEXFERRUM) infusion     lactated ringers       LOS: 2 days  Time spent: Greater than 50% of the 35 minute visit was spent in counseling/coordination of care for the patient  as laid out in the A&P.   Lewie Chamber, MD [facility] 04/24/2021, 2:04 PM

## 2021-04-24 NOTE — ED Notes (Signed)
Endo nurse- Patty called and report was given.  Patient will be NPO except her prep until 10 am.  Patient was made aware.  Patient is having watery green stools, no solids

## 2021-04-24 NOTE — Anesthesia Postprocedure Evaluation (Signed)
Anesthesia Post Note  Patient: [name]  Procedure(s) Performed: COLONOSCOPY WITH PROPOFOL     Patient location during evaluation: PACU Anesthesia Type: MAC Level of consciousness: awake and alert and oriented Pain management: pain level controlled Vital Signs Assessment: post-procedure vital signs reviewed and stable Respiratory status: spontaneous breathing, nonlabored ventilation and respiratory function stable Cardiovascular status: blood pressure returned to baseline Postop Assessment: no apparent nausea or vomiting Anesthetic complications: no   No notable events documented.             Brennan Bailey

## 2021-04-24 NOTE — Progress Notes (Signed)
[name] 12:22 PM  Subjective: Patient doing well and supposedly did well with the prep and no signs of bleeding and no new complaints and she had no problems from her endoscopy yesterday  Objective: Vital signs stable afebrile no acute distress exam please see preassessment evaluation hemoglobin stable  Assessment: Iron deficiency questionable etiology  Plan: Okay to proceed with colonoscopy today with anesthesia assistance  [facility] E  office [phone] After 5PM or if no answer call [phone]

## 2021-04-25 ENCOUNTER — Ambulatory Visit (HOSPITAL_COMMUNITY): Admission: RE | Admit: 2021-04-25 | Payer: Medicaid Other | Source: Ambulatory Visit

## 2021-04-25 ENCOUNTER — Encounter (HOSPITAL_COMMUNITY): Payer: Self-pay

## 2021-04-25 DIAGNOSIS — K922 Gastrointestinal hemorrhage, unspecified: Secondary | ICD-10-CM | POA: Diagnosis not present

## 2021-04-25 DIAGNOSIS — D509 Iron deficiency anemia, unspecified: Secondary | ICD-10-CM | POA: Diagnosis not present

## 2021-04-25 LAB — BASIC METABOLIC PANEL
Anion gap: 6 (ref 5–15)
BUN: <5 mg/dL — ABNORMAL LOW (ref 8–23)
CO2: 26 mmol/L (ref 22–32)
Calcium: 8.4 mg/dL — ABNORMAL LOW (ref 8.9–10.3)
Chloride: 108 mmol/L (ref 98–111)
Creatinine, Ser: 0.61 mg/dL (ref 0.44–1.00)
GFR, Estimated: >60 mL/min (ref >60)
Glucose, Bld: 89 mg/dL (ref 70–99)
Potassium: 3.4 mmol/L — ABNORMAL LOW (ref 3.5–5.1)
Sodium: 140 mmol/L (ref 135–145)

## 2021-04-25 LAB — CBC WITH DIFFERENTIAL/PLATELET
Abs Immature Granulocytes: 0.01 10*3/uL (ref 0.00–0.07)
Basophils Absolute: 0 10*3/uL (ref 0.0–0.1)
Basophils Relative: 1 %
Eosinophils Absolute: 0.2 10*3/uL (ref 0.0–0.5)
Eosinophils Relative: 3 %
HCT: 26.1 % — ABNORMAL LOW (ref 36.0–46.0)
Hemoglobin: 7.5 g/dL — ABNORMAL LOW (ref 12.0–15.0)
Immature Granulocytes: 0 %
Lymphocytes Relative: 26 %
Lymphs Abs: 1.2 10*3/uL (ref 0.7–4.0)
MCH: 21.7 pg — ABNORMAL LOW (ref 26.0–34.0)
MCHC: 28.7 g/dL — ABNORMAL LOW (ref 30.0–36.0)
MCV: 75.4 fL — ABNORMAL LOW (ref 80.0–100.0)
Monocytes Absolute: 0.5 10*3/uL (ref 0.1–1.0)
Monocytes Relative: 11 %
Neutro Abs: 2.8 10*3/uL (ref 1.7–7.7)
Neutrophils Relative %: 59 %
Platelets: 585 10*3/uL — ABNORMAL HIGH (ref 150–400)
RBC: 3.46 MIL/uL — ABNORMAL LOW (ref 3.87–5.11)
RDW: 32.2 % — ABNORMAL HIGH (ref 11.5–15.5)
WBC: 4.7 10*3/uL (ref 4.0–10.5)
nRBC: 0.4 % — ABNORMAL HIGH (ref 0.0–0.2)

## 2021-04-25 LAB — HEMOGLOBIN AND HEMATOCRIT, BLOOD
HCT: 27.7 % — ABNORMAL LOW (ref 36.0–46.0)
Hemoglobin: 7.7 g/dL — ABNORMAL LOW (ref 12.0–15.0)

## 2021-04-25 LAB — MAGNESIUM: Magnesium: 1.5 mg/dL — ABNORMAL LOW (ref 1.7–2.4)

## 2021-04-25 LAB — HIV ANTIBODY (ROUTINE TESTING W REFLEX): HIV Screen 4th Generation wRfx: NONREACTIVE

## 2021-04-25 LAB — GLUCOSE, CAPILLARY
Glucose-Capillary: 92 mg/dL (ref 70–99)
Glucose-Capillary: 155 mg/dL — ABNORMAL HIGH (ref 70–99)

## 2021-04-25 MED ORDER — MAGNESIUM SULFATE 2 GM/50ML IV SOLN
2.0000 g | Freq: Once | INTRAVENOUS | Status: AC
  Administered 2021-04-25: 2 g via INTRAVENOUS
  Filled 2021-04-25: qty 50

## 2021-04-25 MED ORDER — ACETAMINOPHEN 325 MG PO TABS: 650.0000 mg | ORAL_TABLET | ORAL | Status: DC | PRN

## 2021-04-25 MED ORDER — POTASSIUM CHLORIDE 10 MEQ/100ML IV SOLN
10.0000 meq | INTRAVENOUS | Status: AC
  Administered 2021-04-25 (×3): 10 meq via INTRAVENOUS
  Filled 2021-04-25 (×3): qty 100

## 2021-04-25 MED ORDER — POTASSIUM CHLORIDE 10 MEQ/50ML IV SOLN: 10.0000 meq | INTRAVENOUS | Status: DC

## 2021-04-25 MED ORDER — FERROUS SULFATE 325 (65 FE) MG PO TBEC: 325.0000 mg | DELAYED_RELEASE_TABLET | Freq: Every day | ORAL | 3 refills | Status: AC

## 2021-04-25 MED ORDER — PANTOPRAZOLE SODIUM 40 MG PO TBEC: 40.0000 mg | DELAYED_RELEASE_TABLET | Freq: Every day | ORAL | 3 refills | Status: AC

## 2021-04-25 MED ORDER — SODIUM CHLORIDE 0.9 % IV SOLN
1000.0000 mg | Freq: Once | INTRAVENOUS | Status: AC
  Administered 2021-04-25: 1000 mg via INTRAVENOUS
  Filled 2021-04-25: qty 20

## 2021-04-25 MED ORDER — SODIUM CHLORIDE 0.9 % IV SOLN
25.0000 mg | Freq: Once | INTRAVENOUS | Status: AC
  Administered 2021-04-25: 25 mg via INTRAVENOUS
  Filled 2021-04-25: qty 0.5

## 2021-04-26 NOTE — Discharge Summary (Signed)
Physician Discharge Summary   [name] WUX:[id number] DOB: 1956-01-14 DOA: 04/22/2021  PCP: Corine Shelter, MD  Admit date: 04/22/2021 Discharge date: 04/25/2021   Admitted From: Home Disposition: Home Discharging physician: Lewie Chamber, MD  Recommendations for Outpatient Follow-up:  Follow-up iron panel in 3 to 4 months  Home Health:  Equipment/Devices:   Patient discharged to home in Discharge Condition: stable Risk of unplanned readmission score: Unplanned Admission- Pilot do not use: 10.16  CODE STATUS: Full Diet recommendation:   Hospital Course:  [name] is a 65 y.o. female with medical history significant of chronic headache presented to the hospital with headache and abdominal pain.  Patient states she has had headache for about a year and started having slight abdominal discomfort about a month ago.  To help with this pain she has been taking Goody powders at least 2-3 times daily for the past 2 months or so.  Denies taking any other medications at this time.  Admits at times she may notice slight red tinge stool but otherwise no other bleeding.   In the ER patient's hemoglobin was noted to be 4.5, 2 units PRBC transfusion was ordered.  GI was consulted.  Started on PPI twice daily and admitted to the hospital.  Microcytic symptomatic anemia IDA Abdominal pain unspecified - Admission hemoglobin 4.5.  Unknown baseline.  Unclear etiology.  She does use Goody powder therefore could be gastritis/ulcer slow bleeding over time versus lower GI bleed.  Never had C-scope in the past. -EGD on 04/23/2021 showed erosive gastropathy - CLN unremarkable -Received 2 units PRBC on admission. -INFeD never given despite order; only received test dose; discharge on oral iron -    Headache - CT head negative.  Suspect secondary to anemia.   -Improved symptoms when seen this morning   HIV ruled out -Questionable history but repeat testing was negative during  hospitalization  The patient's chronic medical conditions were treated accordingly per the patient's home medication regimen except as noted.  On day of discharge, patient was felt deemed stable for discharge. Patient/family member advised to call PCP or come back to ER if needed.   Principal Diagnosis: Microcytic anemia  Discharge Diagnoses: Active Hospital Problems   Diagnosis Date Noted   Microcytic anemia 04/22/2021   GI bleed 04/22/2021   HIV (human immunodeficiency virus infection) (HCC) 04/22/2021    Resolved Hospital Problems  No resolved problems to display.    Discharge Instructions     Increase activity slowly   Complete by: As directed       Allergies as of 04/25/2021   No Known Allergies      Medication List     STOP taking these medications    cefUROXime 250 MG tablet Commonly known as: CEFTIN   GOODY HEADACHE PO       TAKE these medications    ferrous sulfate 325 (65 FE) MG EC tablet Take 1 tablet (325 mg total) by mouth daily with breakfast.   pantoprazole 40 MG tablet Commonly known as: PROTONIX Take 1 tablet (40 mg total) by mouth daily.        No Known Allergies  Consultations: GI  Discharge Exam: BP 118/77 (BP Location: Left Arm)   Pulse 66   Temp 98.5 F (36.9 C) (Oral)   Resp 17   Ht 5\' 4"  (1.626 m)   Wt 64 kg   SpO2 100%   BMI 24.22 kg/m  General appearance: alert, cooperative, and no distress Head: Normocephalic, without obvious  abnormality, atraumatic Eyes:  eomi Lungs: clear to auscultation bilaterally Heart: regular rate and rhythm and S1, S2 normal Abdomen: normal findings: bowel sounds normal and soft, non-tender Extremities:  No edema Skin: mobility and turgor normal Neurologic: Grossly normal  The results of significant diagnostics from this hospitalization (including imaging, microbiology, ancillary and laboratory) are listed below for reference.   Microbiology: Recent Results (from the past 240 hour(s))   Resp Panel by RT-PCR (Flu A&B, Covid) Nasopharyngeal Swab     Status: None   Collection Time: 04/22/21  4:22 PM   Specimen: Nasopharyngeal Swab; Nasopharyngeal(NP) swabs in vial transport medium  Result Value Ref Range Status   SARS Coronavirus 2 by RT PCR NEGATIVE NEGATIVE Final    Comment: (NOTE) SARS-CoV-2 target nucleic acids are NOT DETECTED.  The SARS-CoV-2 RNA is generally detectable in upper respiratory specimens during the acute phase of infection. The lowest concentration of SARS-CoV-2 viral copies this assay can detect is 138 copies/mL. A negative result does not preclude SARS-Cov-2 infection and should not be used as the sole basis for treatment or other patient management decisions. A negative result may occur with  improper specimen collection/handling, submission of specimen other than nasopharyngeal swab, presence of viral mutation(s) within the areas targeted by this assay, and inadequate number of viral copies(<138 copies/mL). A negative result must be combined with clinical observations, patient history, and epidemiological information. The expected result is Negative.  Fact Sheet for Patients:  BloggerCourse.com  Fact Sheet for Healthcare Providers:  SeriousBroker.it  This test is no t yet approved or cleared by the [place] FDA and  has been authorized for detection and/or diagnosis of SARS-CoV-2 by FDA under an Emergency Use Authorization (EUA). This EUA will remain  in effect (meaning this test can be used) for the duration of the COVID-19 declaration under Section 564(b)(1) of the Act, 21 U.S.C.section 360bbb-3(b)(1), unless the authorization is terminated  or revoked sooner.       Influenza A by PCR NEGATIVE NEGATIVE Final   Influenza B by PCR NEGATIVE NEGATIVE Final    Comment: (NOTE) The Xpert Xpress SARS-CoV-2/FLU/RSV plus assay is intended as an aid in the diagnosis of influenza from  Nasopharyngeal swab specimens and should not be used as a sole basis for treatment. Nasal washings and aspirates are unacceptable for Xpert Xpress SARS-CoV-2/FLU/RSV testing.  Fact Sheet for Patients: BloggerCourse.com  Fact Sheet for Healthcare Providers: SeriousBroker.it  This test is not yet approved or cleared by the [place] FDA and has been authorized for detection and/or diagnosis of SARS-CoV-2 by FDA under an Emergency Use Authorization (EUA). This EUA will remain in effect (meaning this test can be used) for the duration of the COVID-19 declaration under Section 564(b)(1) of the Act, 21 U.S.C. section 360bbb-3(b)(1), unless the authorization is terminated or revoked.  Performed at [facility], [address] [place], [place] [address]      Labs: BNP (last 3 results) No results for input(s): BNP in the last 8760 hours. Basic Metabolic Panel: Recent Labs  Lab 04/22/21 1413 04/23/21 0500 04/24/21 1537 04/25/21 0421  NA 139 138  --  140  K 3.6 3.5  --  3.4*  CL 105 104  --  108  CO2 26 25  --  26  GLUCOSE 89 147*  --  89  BUN 8 6*  --  <5*  CREATININE 0.67 0.64  --  0.61  CALCIUM 9.0 8.5*  --  8.4*  MG  --  1.7 1.6* 1.5*   Liver Function Tests: Recent Labs  Lab 04/22/21 1413 04/23/21 0500  AST 23 22  ALT 9 8  ALKPHOS 59 62  BILITOT 0.9 1.6*  PROT 7.6 7.1  ALBUMIN 3.9 3.5   Recent Labs  Lab 04/22/21 1413  LIPASE 33   No results for input(s): AMMONIA in the last 168 hours. CBC: Recent Labs  Lab 04/22/21 1413 04/22/21 1723 04/23/21 0500 04/24/21 0815 04/25/21 0421 04/25/21 1335  WBC 4.4  --  3.7* 4.5 4.7  --   NEUTROABS  --   --   --  2.8 2.8  --   HGB 4.5*  --  8.1* 8.2* 7.5* 7.7*  HCT 17.4* 18.3* 27.8* 28.2* 26.1* 27.7*  MCV 66.7*  --  75.1* 75.6* 75.4*  --   PLT 799*  --  496* 490* 585*  --    Cardiac Enzymes: No results for input(s): CKTOTAL, CKMB, CKMBINDEX,  TROPONINI in the last 168 hours. BNP: Invalid input(s): POCBNP CBG: Recent Labs  Lab 04/23/21 1840 04/24/21 1734 04/24/21 2326 04/25/21 0558 04/25/21 1120  GLUCAP 71 116* 94 92 155*   D-Dimer No results for input(s): DDIMER in the last 72 hours. Hgb A1c No results for input(s): HGBA1C in the last 72 hours. Lipid Profile No results for input(s): CHOL, HDL, LDLCALC, TRIG, CHOLHDL, LDLDIRECT in the last 72 hours. Thyroid function studies No results for input(s): TSH, T4TOTAL, T3FREE, THYROIDAB in the last 72 hours.  Invalid input(s): FREET3 Anemia work up No results for input(s): VITAMINB12, FOLATE, FERRITIN, TIBC, IRON, RETICCTPCT in the last 72 hours. Urinalysis    Component Value Date/Time   COLORURINE YELLOW 04/23/2021 0916   APPEARANCEUR CLEAR 04/23/2021 0916   LABSPEC 1.031 (H) 04/23/2021 0916   PHURINE 5.0 04/23/2021 0916   GLUCOSEU NEGATIVE 04/23/2021 0916   HGBUR NEGATIVE 04/23/2021 0916   BILIRUBINUR NEGATIVE 04/23/2021 0916   KETONESUR NEGATIVE 04/23/2021 0916   PROTEINUR NEGATIVE 04/23/2021 0916   UROBILINOGEN 1.0 12/05/2008 1505   NITRITE NEGATIVE 04/23/2021 0916   LEUKOCYTESUR NEGATIVE 04/23/2021 0916   Sepsis Labs Invalid input(s): PROCALCITONIN,  WBC,  LACTICIDVEN Microbiology Recent Results (from the past 240 hour(s))  Resp Panel by RT-PCR (Flu A&B, Covid) Nasopharyngeal Swab     Status: None   Collection Time: 04/22/21  4:22 PM   Specimen: Nasopharyngeal Swab; Nasopharyngeal(NP) swabs in vial transport medium  Result Value Ref Range Status   SARS Coronavirus 2 by RT PCR NEGATIVE NEGATIVE Final    Comment: (NOTE) SARS-CoV-2 target nucleic acids are NOT DETECTED.  The SARS-CoV-2 RNA is generally detectable in upper respiratory specimens during the acute phase of infection. The lowest concentration of SARS-CoV-2 viral copies this assay can detect is 138 copies/mL. A negative result does not preclude SARS-Cov-2 infection and should not be used as  the sole basis for treatment or other patient management decisions. A negative result may occur with  improper specimen collection/handling, submission of specimen other than nasopharyngeal swab, presence of viral mutation(s) within the areas targeted by this assay, and inadequate number of viral copies(<138 copies/mL). A negative result must be combined with clinical observations, patient history, and epidemiological information. The expected result is Negative.  Fact Sheet for Patients:  BloggerCourse.com  Fact Sheet for Healthcare Providers:  SeriousBroker.it  This test is no t yet approved or cleared by the [place] FDA and  has been authorized for detection and/or diagnosis of SARS-CoV-2 by FDA under an Emergency Use Authorization (EUA). This EUA will remain  in effect (meaning this test can be used) for the duration of the COVID-19 declaration under Section 564(b)(1) of the Act, 21 U.S.C.section 360bbb-3(b)(1), unless the authorization is terminated  or revoked sooner.       Influenza A by PCR NEGATIVE NEGATIVE Final   Influenza B by PCR NEGATIVE NEGATIVE Final    Comment: (NOTE) The Xpert Xpress SARS-CoV-2/FLU/RSV plus assay is intended as an aid in the diagnosis of influenza from Nasopharyngeal swab specimens and should not be used as a sole basis for treatment. Nasal washings and aspirates are unacceptable for Xpert Xpress SARS-CoV-2/FLU/RSV testing.  Fact Sheet for Patients: BloggerCourse.com  Fact Sheet for Healthcare Providers: SeriousBroker.it  This test is not yet approved or cleared by the [place] FDA and has been authorized for detection and/or diagnosis of SARS-CoV-2 by FDA under an Emergency Use Authorization (EUA). This EUA will remain in effect (meaning this test can be used) for the duration of the COVID-19 declaration under Section 564(b)(1) of  the Act, 21 U.S.C. section 360bbb-3(b)(1), unless the authorization is terminated or revoked.  Performed at [facility], [address] [place], [place] [address]     Procedures/Studies: CT Head Wo Contrast  Result Date: 04/22/2021 CLINICAL DATA:  New or worsening headache in a 65 year old female. EXAM: CT HEAD WITHOUT CONTRAST TECHNIQUE: Contiguous axial images were obtained from the base of the skull through the vertex without intravenous contrast. COMPARISON:  December 03, 2008 and prior MRI of the brain. FINDINGS: Brain: No evidence of acute infarction, hemorrhage, hydrocephalus, extra-axial collection or mass lesion/mass effect. Vascular: No hyperdense vessel or unexpected calcification. Skull: Normal. Negative for fracture or focal lesion. Sinuses/Orbits: Visualized paranasal sinuses and orbits are unremarkable. Other: None. IMPRESSION: No acute intracranial abnormality. Electronically Signed   By: Donzetta Kohut M.D.   On: 04/22/2021 15:10   CT ABDOMEN PELVIS W CONTRAST  Result Date: 04/22/2021 CLINICAL DATA:  Acute nonlocalized abdominal pain.  Anemia. EXAM: CT ABDOMEN AND PELVIS WITH CONTRAST TECHNIQUE: Multidetector CT imaging of the abdomen and pelvis was performed using the standard protocol following bolus administration of intravenous contrast. CONTRAST:  OMNIPAQUE IOHEXOL 300 MG/ML  SOLN COMPARISON:  None. FINDINGS: Lower chest: Streaky opacities in both lower lobes favor atelectasis or scarring. There is no pleural effusion. Heart size normal. Hepatobiliary: Diffusely decreased hepatic density consistent with steatosis. No focal liver abnormality is seen. No gallstone or pericholecystic inflammation. There is no biliary dilatation. Pancreas: No ductal dilatation or inflammation. Spleen: Normal in size without focal abnormality. Adrenals/Urinary Tract: No adrenal nodules. No hydronephrosis. Suspected punctate nonobstructing stone in the upper right kidney.  Homogeneous renal enhancement. Symmetric renal excretion on delayed phase imaging no focal renal lesion. Physiologically distended urinary bladder without wall thickening. Stomach/Bowel: Tiny hiatal hernia. The stomach is decompressed. There is no small bowel obstruction or inflammatory change. Normal appendix tentatively visualized. There is no appendicitis. Small to moderate colonic stool burden. No colonic inflammation or wall thickening. Vascular/Lymphatic: Normal caliber abdominal aorta. Patent portal vein. No acute vascular findings. No bulky abdominopelvic adenopathy. No definite enlarged abdominopelvic lymph nodes. Reproductive: Retroverted uterus.  No adnexal mass. Other: No ascites or free air. Tiny fat containing umbilical hernia. Musculoskeletal: Mild degenerative change in the spine. Vacuum phenomena at L3-L4. IMPRESSION: 1. No acute abnormality or explanation for abdominal pain. 2. Hepatic steatosis. 3. Suspected punctate nonobstructing stone in the upper right kidney. Electronically Signed   By: Narda Rutherford M.D.   On: 04/22/2021 20:00  Time coordinating discharge: Over 30 minutes    Lewie Chamberavid Darriana Deboy, MD  [facility] 04/26/2021, 4:02 PM

## 2022-08-23 IMAGING — CT CT HEAD W/O CM
3 series · 16 of 47 positions shown, 19 images · non-contrast
Comparison: December 03, 2008 and prior MRI of the brain.

CLINICAL DATA: New or worsening headache in a 64-year-old female.

EXAM:
CT HEAD WITHOUT CONTRAST
TECHNIQUE: Contiguous axial images were obtained from the base of the skull
through the vertex without intravenous contrast.

[Series 2: head wo · axial · 0.44mm/px · z∈[-127,+8]mm · 10 of 33 slices shown, 13 images]
[im 3/33  brain]
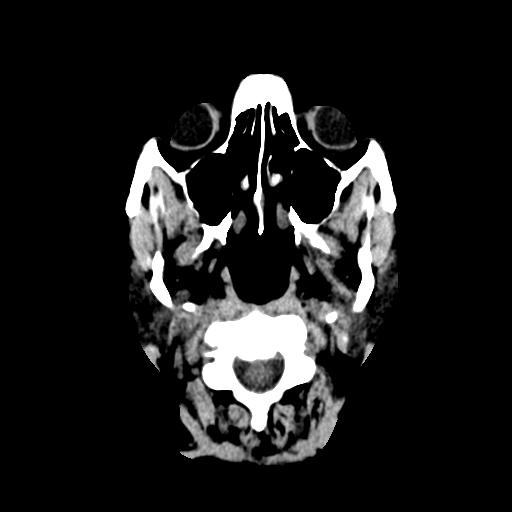
[im 3/33  bone]
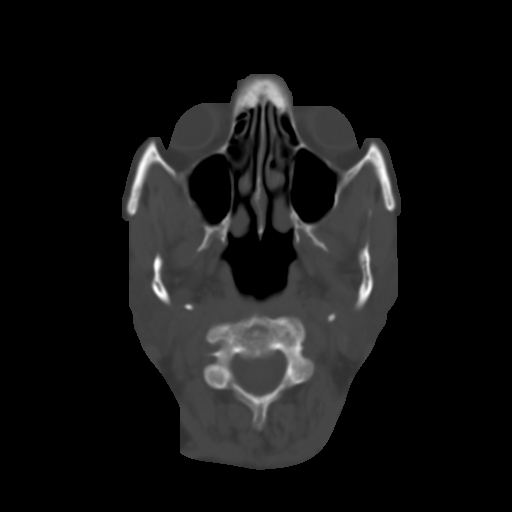
[im 6/33  brain]
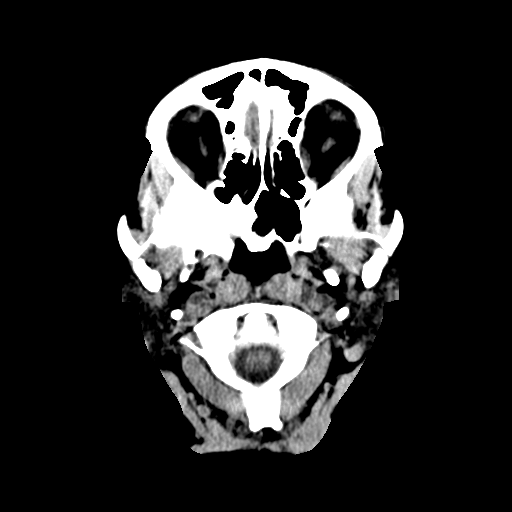
[im 9/33  brain]
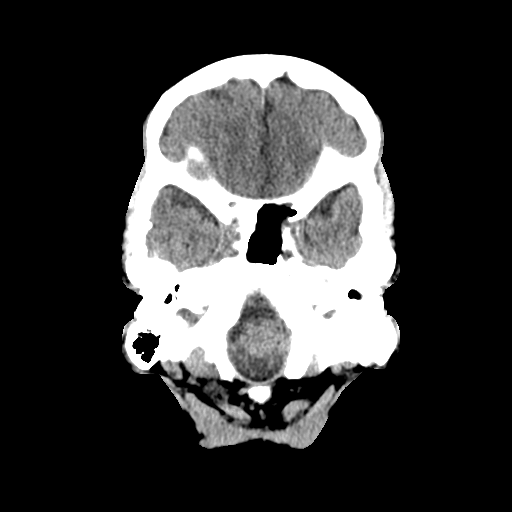
[im 12/33  brain]
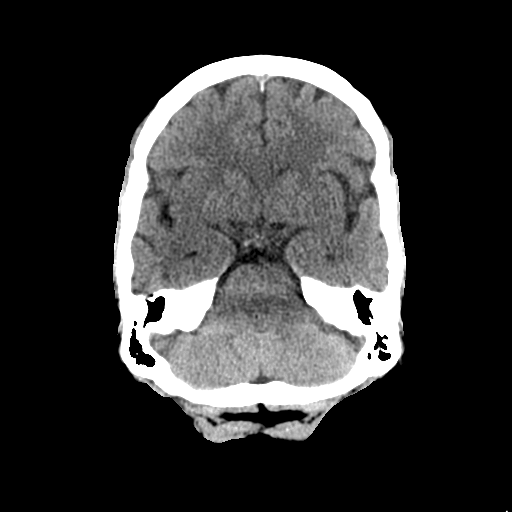
[im 15/33  brain]
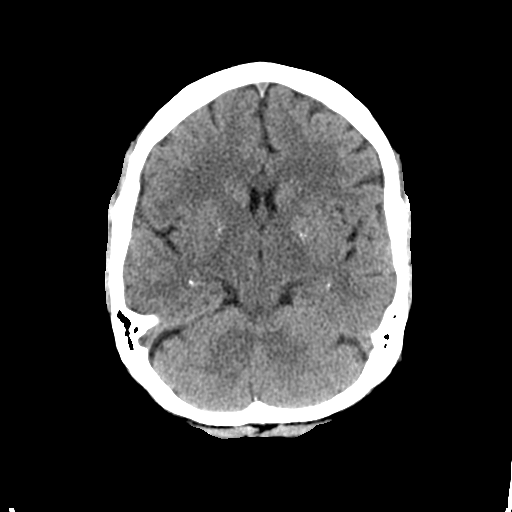
[im 15/33  bone]
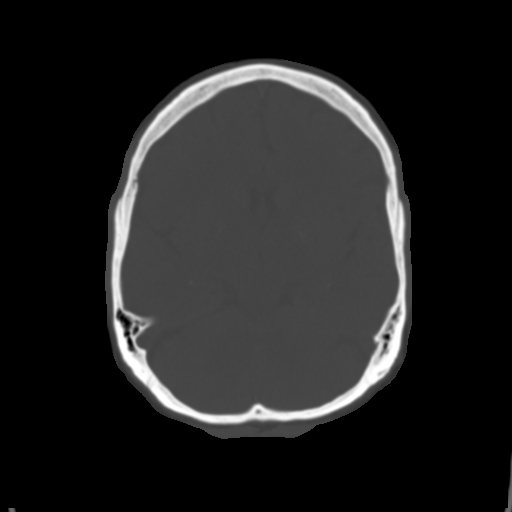
[im 18/33  brain]
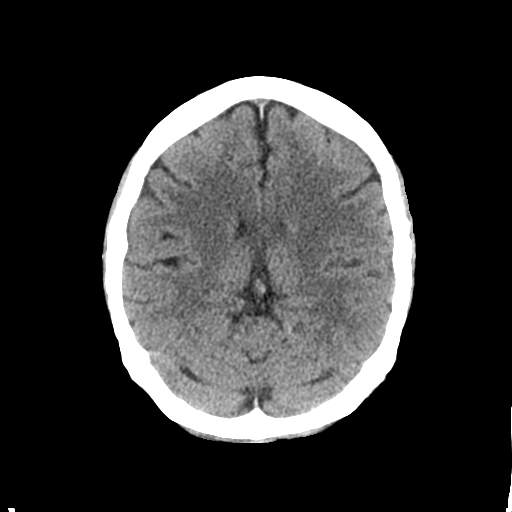
[im 21/33  brain]
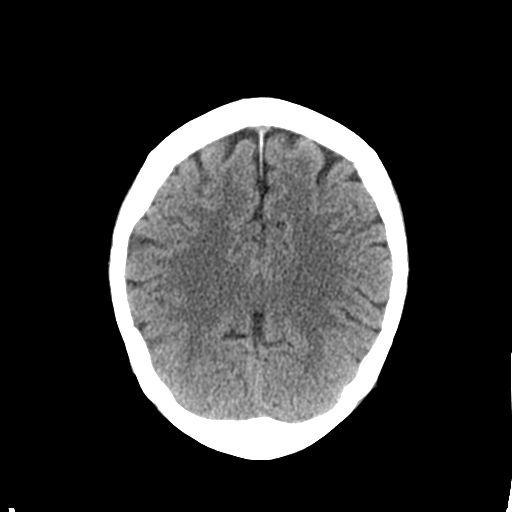
[im 25/33  brain]
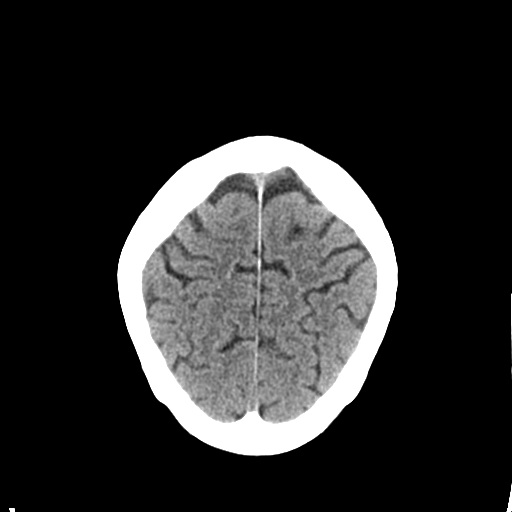
[im 27/33  brain]
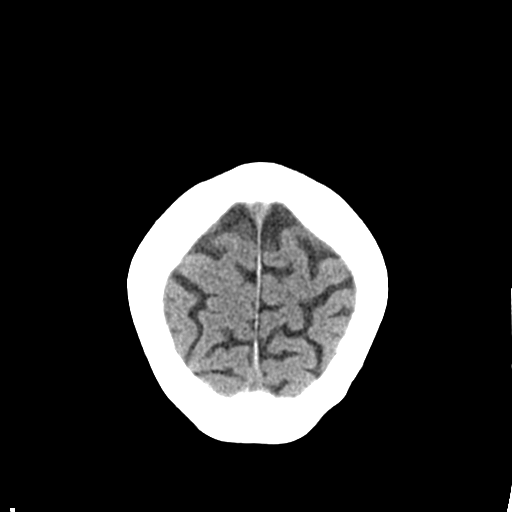
[im 27/33  bone]
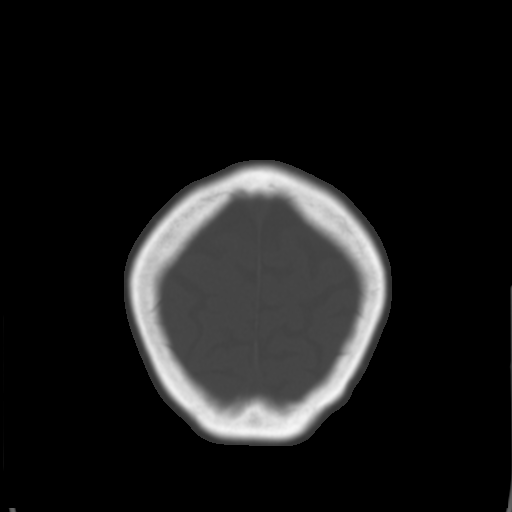
[im 30/33  brain]
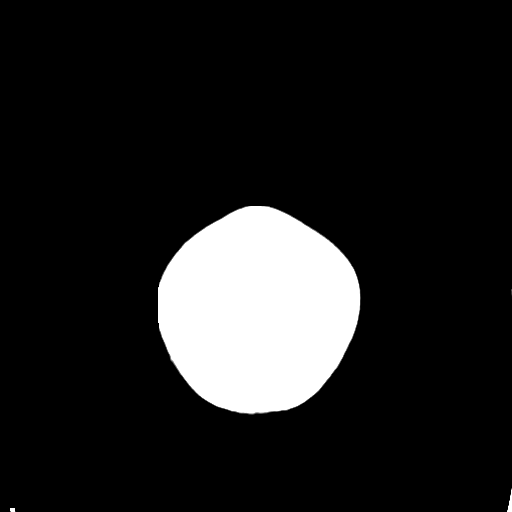

[Series 4: coronal soft tissue · coronal · 0.30mm/px · 3 of 69 slices shown]
[im 23/69  brain]
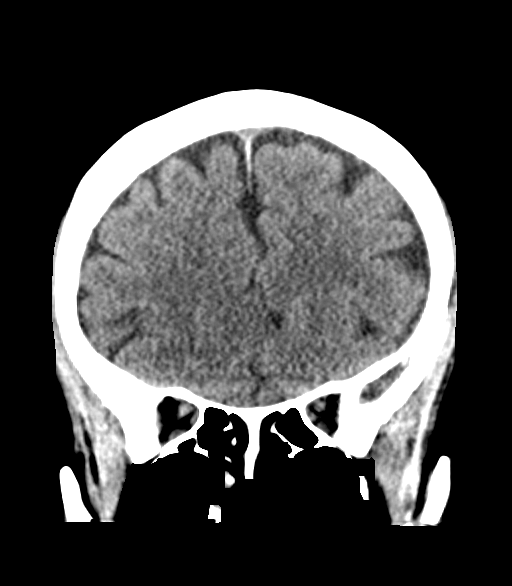
[im 31/69  brain]
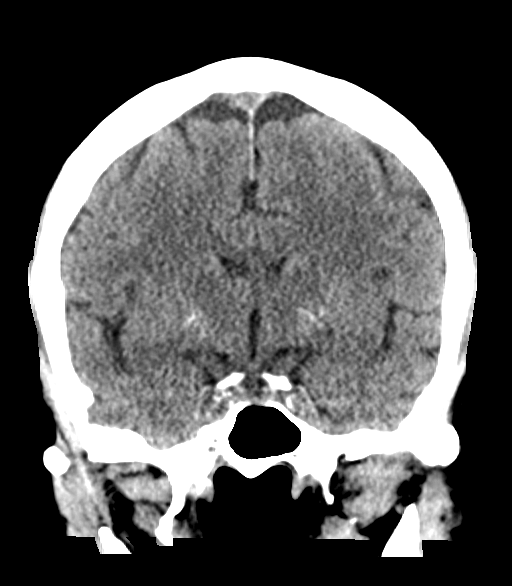
[im 38/69  brain]
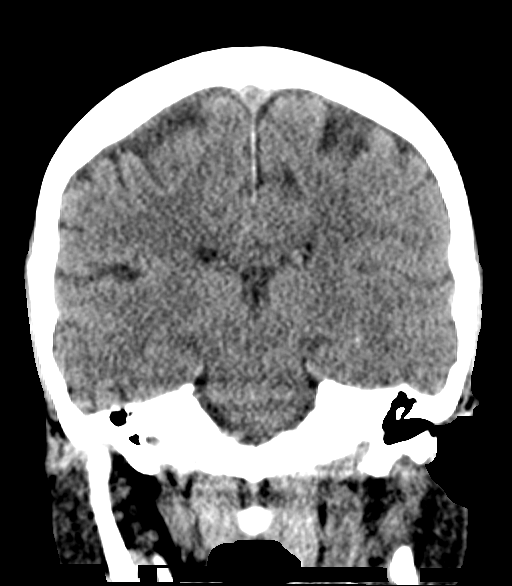

[Series 5: sagittal soft tissue · sagittal · 0.34mm/px · 3 of 49 slices shown]
[im 17/49  brain]
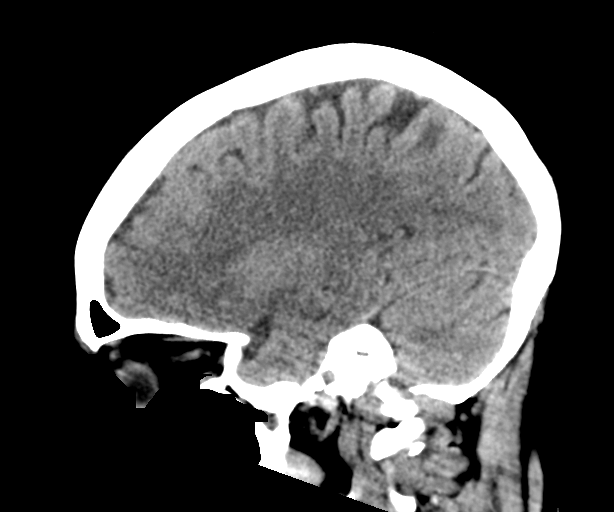
[im 25/49  brain]
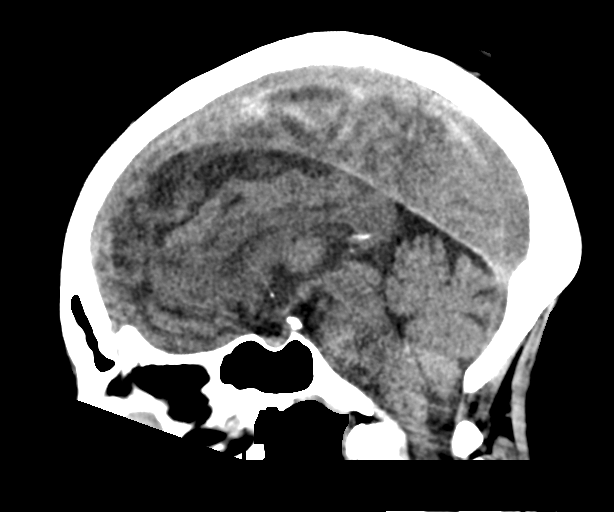
[im 33/49  brain]
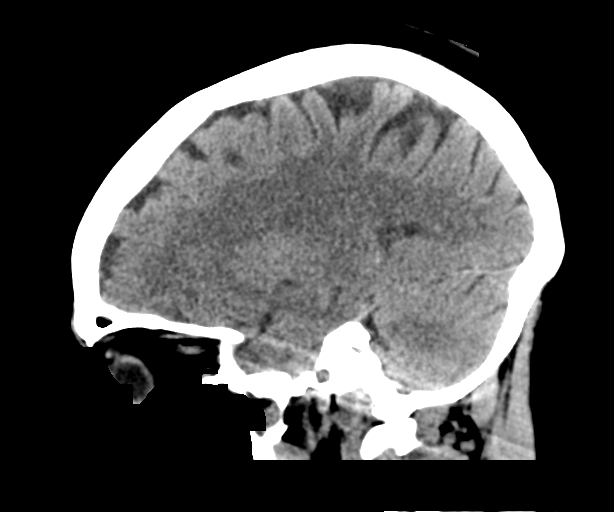

[16 of 47 positions shown; findings below may reference images not displayed]

FINDINGS: Brain: No evidence of acute infarction, hemorrhage, hydrocephalus,
extra-axial collection or mass lesion/mass effect.

Vascular: No hyperdense vessel or unexpected calcification.

Skull: Normal. Negative for fracture or focal lesion.

Sinuses/Orbits: Visualized paranasal sinuses and orbits are
unremarkable.

Other: None.
IMPRESSION: No acute intracranial abnormality.
